# Patient Record
Sex: Male | Born: 1975
Health system: Southern US, Community
[De-identification: ages and names within clinical notes are randomized; demographics above are authoritative.]

## PROBLEM LIST (undated history)

## (undated) DIAGNOSIS — K219 Gastro-esophageal reflux disease without esophagitis: Secondary | ICD-10-CM

## (undated) DIAGNOSIS — F988 Other specified behavioral and emotional disorders with onset usually occurring in childhood and adolescence: Secondary | ICD-10-CM

## (undated) DIAGNOSIS — J302 Other seasonal allergic rhinitis: Secondary | ICD-10-CM

## (undated) HISTORY — PX: RHINOPLASTY: SUR1284

## (undated) HISTORY — DX: Gastro-esophageal reflux disease without esophagitis: K21.9

---

## 2013-11-28 ENCOUNTER — Encounter (HOSPITAL_COMMUNITY): Payer: Self-pay | Admitting: Emergency Medicine

## 2013-11-28 ENCOUNTER — Emergency Department (HOSPITAL_COMMUNITY)
Admission: EM | Admit: 2013-11-28 | Discharge: 2013-11-28 | Disposition: A | Discharge status: Home / Self Care | Payer: No Typology Code available for payment source | Attending: Emergency Medicine | Admitting: Emergency Medicine

## 2013-11-28 ENCOUNTER — Emergency Department (HOSPITAL_COMMUNITY): Payer: No Typology Code available for payment source

## 2013-11-28 DIAGNOSIS — J189 Pneumonia, unspecified organism: Secondary | ICD-10-CM

## 2013-11-28 DIAGNOSIS — J159 Unspecified bacterial pneumonia: Secondary | ICD-10-CM | POA: Insufficient documentation

## 2013-11-28 DIAGNOSIS — K219 Gastro-esophageal reflux disease without esophagitis: Secondary | ICD-10-CM | POA: Insufficient documentation

## 2013-11-28 DIAGNOSIS — K5900 Constipation, unspecified: Secondary | ICD-10-CM | POA: Insufficient documentation

## 2013-11-28 DIAGNOSIS — Z79899 Other long term (current) drug therapy: Secondary | ICD-10-CM | POA: Insufficient documentation

## 2013-11-28 DIAGNOSIS — H6690 Otitis media, unspecified, unspecified ear: Secondary | ICD-10-CM | POA: Insufficient documentation

## 2013-11-28 DIAGNOSIS — Z8659 Personal history of other mental and behavioral disorders: Secondary | ICD-10-CM | POA: Insufficient documentation

## 2013-11-28 DIAGNOSIS — R0789 Other chest pain: Secondary | ICD-10-CM | POA: Insufficient documentation

## 2013-11-28 DIAGNOSIS — I10 Essential (primary) hypertension: Secondary | ICD-10-CM | POA: Insufficient documentation

## 2013-11-28 DIAGNOSIS — R109 Unspecified abdominal pain: Secondary | ICD-10-CM | POA: Insufficient documentation

## 2013-11-28 DIAGNOSIS — H6691 Otitis media, unspecified, right ear: Secondary | ICD-10-CM

## 2013-11-28 HISTORY — DX: Other specified behavioral and emotional disorders with onset usually occurring in childhood and adolescence: F98.8

## 2013-11-28 HISTORY — DX: Other seasonal allergic rhinitis: J30.2

## 2013-11-28 LAB — CBC
HEMATOCRIT: 43 % (ref 39.0–52.0)
HEMOGLOBIN: 14.2 g/dL (ref 13.0–17.0)
MCH: 23.4 pg — ABNORMAL LOW (ref 26.0–34.0)
MCHC: 33 g/dL (ref 30.0–36.0)
MCV: 70.8 fL — AB (ref 78.0–100.0)
Platelets: 209 10*3/uL (ref 150–400)
RBC: 6.07 MIL/uL — ABNORMAL HIGH (ref 4.22–5.81)
RDW: 16 % — ABNORMAL HIGH (ref 11.5–15.5)
WBC: 8 10*3/uL (ref 4.0–10.5)

## 2013-11-28 LAB — BASIC METABOLIC PANEL
BUN: 9 mg/dL (ref 6–23)
CO2: 29 mEq/L (ref 19–32)
CREATININE: 0.97 mg/dL (ref 0.50–1.35)
Calcium: 9.2 mg/dL (ref 8.4–10.5)
Chloride: 103 mEq/L (ref 96–112)
GFR calc Af Amer: >90 mL/min (ref >90)
GFR calc non Af Amer: >90 mL/min (ref >90)
GLUCOSE: 92 mg/dL (ref 70–99)
POTASSIUM: 4.6 meq/L (ref 3.7–5.3)
Sodium: 143 mEq/L (ref 137–147)

## 2013-11-28 LAB — TROPONIN I
Troponin I: <0.3 ng/mL (ref <0.30)
Troponin I: <0.3 ng/mL (ref <0.30)

## 2013-11-28 MED ORDER — AZITHROMYCIN 250 MG PO TABS
500.0000 mg | ORAL_TABLET | Freq: Once | ORAL | Status: AC
  Administered 2013-11-28: 500 mg via ORAL
  Filled 2013-11-28: qty 2

## 2013-11-28 MED ORDER — RANITIDINE HCL 150 MG PO TABS: 150.0000 mg | ORAL_TABLET | Freq: Two times a day (BID) | ORAL | Status: DC

## 2013-11-28 MED ORDER — AZITHROMYCIN 250 MG PO TABS: 250.0000 mg | ORAL_TABLET | Freq: Every day | ORAL | Status: DC

## 2013-11-28 MED ORDER — PREDNISONE 20 MG PO TABS: ORAL_TABLET | ORAL | Status: DC

## 2013-11-28 MED ORDER — ANTIPYRINE-BENZOCAINE 5.4-1.4 % OT SOLN
2.0000 [drp] | Freq: Once | OTIC | Status: AC
  Administered 2013-11-28: 2 [drp] via OTIC
  Filled 2013-11-28: qty 10

## 2013-11-28 MED ORDER — ALBUTEROL SULFATE HFA 108 (90 BASE) MCG/ACT IN AERS
2.0000 | INHALATION_SPRAY | Freq: Once | RESPIRATORY_TRACT | Status: AC
  Administered 2013-11-28: 2 via RESPIRATORY_TRACT
  Filled 2013-11-28: qty 6.7

## 2013-11-28 NOTE — ED Notes (Signed)
PT ambulated with baseline gait; VSS; A&Ox3; no signs of distress; respirations even and unlabored; skin warm and dry; no questions upon discharge.  

## 2013-11-28 NOTE — Discharge Instructions (Signed)
Please call your doctor for a followup appointment within 24-48 hours. When you talk to your doctor please let them know that you were seen in the emergency department and have them acquire all of your records so that they can discuss the findings with you and formulate a treatment plan to fully care for your new and ongoing problems. Please rest and stay hydrated Please take antibiotics as prescribed for pneumonia and ear infection  Please avoid any physical or strenuous activity Please continue to monitor symptoms closely and if symptoms are to worsen or change (fever greater than 101, chills, sweating, nausea, vomiting, diarrhea, chest pain, shortness of breath, difficulty breathing, coughing up bright red blood, dizziness, weakness, inability to keep food or fluid down) please report back to the ED immediately  Otitis Media, Adult Otitis media is redness, soreness, and puffiness (swelling) in the space just behind your eardrum (middle ear). It may be caused by allergies or infection. It often happens along with a cold. HOME CARE  Take your medicine as told. Finish it even if you start to feel better.  Only take over-the-counter or prescription medicines for pain, discomfort, or fever as told by your doctor.  Follow up with your doctor as told. GET HELP IF:  You have otitis media only in one ear or bleeding from your nose or both.  You notice a lump on your neck.  You are not getting better in 3 5 days.  You feel worse instead of better. GET HELP RIGHT AWAY IF:   You have pain that is not helped with medicine.  You have puffiness, redness, or pain around your ear.  You get a stiff neck.  You cannot move part of your face (paralysis).  You notice that the bone behind your ear hurts when you touch it. MAKE SURE YOU:   Understand these instructions.  Will watch your condition.  Will get help right away if you are not doing well or get worse. Document Released: 03/12/2008  Document Revised: 05/27/2013 Document Reviewed: 04/21/2013 [facility] Patient Information 2014 [place], [place]. Pneumonia, Adult Pneumonia is an infection of the lungs.  CAUSES Pneumonia may be caused by bacteria or a virus. Usually, these infections are caused by breathing infectious particles into the lungs (respiratory tract). SYMPTOMS   Cough.  Fever.  Chest pain.  Increased rate of breathing.  Wheezing.  Mucus production. DIAGNOSIS  If you have the common symptoms of pneumonia, your caregiver will typically confirm the diagnosis with a chest X-ray. The X-ray will show an abnormality in the lung (pulmonary infiltrate) if you have pneumonia. Other tests of your blood, urine, or sputum may be done to find the specific cause of your pneumonia. Your caregiver may also do tests (blood gases or pulse oximetry) to see how well your lungs are working. TREATMENT  Some forms of pneumonia may be spread to other people when you cough or sneeze. You may be asked to wear a mask before and during your exam. Pneumonia that is caused by bacteria is treated with antibiotic medicine. Pneumonia that is caused by the influenza virus may be treated with an antiviral medicine. Most other viral infections must run their course. These infections will not respond to antibiotics.  PREVENTION A pneumococcal shot (vaccine) is available to prevent a common bacterial cause of pneumonia. This is usually suggested for:  People over 63 years old.  Patients on chemotherapy.  People with chronic lung problems, such as bronchitis or emphysema.  People with immune system  problems. If you are over 65 or have a high risk condition, you may receive the pneumococcal vaccine if you have not received it before. In some countries, a routine influenza vaccine is also recommended. This vaccine can help prevent some cases of pneumonia.You may be offered the influenza vaccine as part of your care. If you smoke, it is time to  quit. You may receive instructions on how to stop smoking. Your caregiver can provide medicines and counseling to help you quit. HOME CARE INSTRUCTIONS   Cough suppressants may be used if you are losing too much rest. However, coughing protects you by clearing your lungs. You should avoid using cough suppressants if you can.  Your caregiver may have prescribed medicine if he or she thinks your pneumonia is caused by a bacteria or influenza. Finish your medicine even if you start to feel better.  Your caregiver may also prescribe an expectorant. This loosens the mucus to be coughed up.  Only take over-the-counter or prescription medicines for pain, discomfort, or fever as directed by your caregiver.  Do not smoke. Smoking is a common cause of bronchitis and can contribute to pneumonia. If you are a smoker and continue to smoke, your cough may last several weeks after your pneumonia has cleared.  A cold steam vaporizer or humidifier in your room or home may help loosen mucus.  Coughing is often worse at night. Sleeping in a semi-upright position in a recliner or using a couple pillows under your head will help with this.  Get rest as you feel it is needed. Your body will usually let you know when you need to rest. SEEK IMMEDIATE MEDICAL CARE IF:   Your illness becomes worse. This is especially true if you are elderly or weakened from any other disease.  You cannot control your cough with suppressants and are losing sleep.  You begin coughing up blood.  You develop pain which is getting worse or is uncontrolled with medicines.  You have a fever.  Any of the symptoms which initially brought you in for treatment are getting worse rather than better.  You develop shortness of breath or chest pain. MAKE SURE YOU:   Understand these instructions.  Will watch your condition.  Will get help right away if you are not doing well or get worse. Document Released: 09/24/2005 Document  Revised: 12/17/2011 Document Reviewed: 12/14/2010 [facility] Patient Information 2014 [place], [place].   Emergency Department Resource Guide 1) Find a Doctor and Pay Out of Pocket Although you won't have to find out who is covered by your insurance plan, it is a good idea to ask around and get recommendations. You will then need to call the office and see if the doctor you have chosen will accept you as a new patient and what types of options they offer for patients who are self-pay. Some doctors offer discounts or will set up payment plans for their patients who do not have insurance, but you will need to ask so you aren't surprised when you get to your appointment.  2) Contact Your Local Health Department Not all health departments have doctors that can see patients for sick visits, but many do, so it is worth a call to see if yours does. If you don't know where your local health department is, you can check in your phone book. The CDC also has a tool to help you locate your state's health department, and many state websites also have listings of all of their local  health departments.  3) Find a Walk-in Clinic If your illness is not likely to be very severe or complicated, you may want to try a walk in clinic. These are popping up all over the country in pharmacies, drugstores, and shopping centers. They're usually staffed by nurse practitioners or physician assistants that have been trained to treat common illnesses and complaints. They're usually fairly quick and inexpensive. However, if you have serious medical issues or chronic medical problems, these are probably not your best option.  No Primary Care Doctor: - Call Health Connect at  [phone] - they can help you locate a primary care doctor that  accepts your insurance, provides certain services, etc. - Physician Referral Service- [phone]  Chronic Pain Problems: Organization         Address  Phone   Notes  [facility]  [phone] Patients need to be referred by their primary care doctor.   Medication Assistance: Organization         Address  Phone   Notes  [facility] Medication [facility] [address] [place] [place], [place] [address] [phone] --Must be a resident of [facility] -- Must have NO insurance coverage whatsoever (no Medicaid/ Medicare, etc.) -- The pt. MUST have a primary care doctor that directs their care regularly and follows them in the community   [facility]ist  [phone]   [employer]  [phone]    Agencies that provide inexpensive medical care: Organization         Address  Phone   Notes  [facility]  [phone]   [facility]    [phone]   [facility] [address] [place], [place] [address] [phone]   [facility] [address] [place] [place] [phone]   Planned Parenthood    [phone]   [facility]    [phone]   [facility]  [address], [place] Phone:  [phone], Fax:  [phone] Hours of Operation:  9 am - 6 pm, M-F.  Also accepts Medicaid/Medicare and self-pay.  [facility]  [address], [place] Phone: [phone], Fax: [phone]. Hours of Operation:  8:30 am - 5:30 pm, M-F.  Also accepts Medicaid and self-pay.  [facility] [place] [address] [place] Phone: [phone]   [facility] [address] Natasha Bence [place], [place] [phone], Ext. [phone] Mondays & Thursdays: 7-9 AM.  First 15 patients are seen on a first come, first serve basis.    Medicaid-accepting [facility] Providers:  Organization         Address  Phone   Notes  [facility] [address], [place] [phone] Also accepts self-pay patients.  [facility] [address] Laurell Josephs [place],  [place]  [phone]   [facility] [address], [place] [phone]   [facility] [address] [place] [phone]   Renaye Rakers [address], [place]   [phone] Only accepts [place] patients after they have their name applied to their card.   Self-Pay (no insurance) in [facility]:  Organization         Address  Phone   Notes  Sickle Cell Patients, [employer]  Internal Medicine [address] [place], [place] [phone]   [facility] [address] [place], [place] [phone]   [facility] [place]  [address], [place] [phone]   [facility]/Dr. Osei-Bonsu  [address] [place] or [address], [place] [phone] Phone number for both [place] and [place] locations is the same.  [facility] [address] [place] [phone]   [facility] [address] [place] or [address] [phone]   [facility] [address] [place] [phone], phone; [phone], fax Sees patients 1st and 3rd Saturday of every month.  Must not qualify for public or private insurance (i.e. Medicaid, Medicare, [place] Health Choice, Veterans' Benefits)  Household income should be no more than 200% of the poverty level The clinic cannot treat you if you are pregnant or think you are pregnant  Sexually transmitted diseases are not treated at the clinic.    Dental Care: Organization         Address  Phone  Notes  [facility] [address] [place], [place] [phone] Accepts children up to age 73 who are enrolled in [place] or [place] Health Choice; pregnant women with a Medicaid card; and children who have applied for Medicaid or [place] [facility]  Choice, but were declined, whose parents can pay a reduced fee at time of service.  [facility] Department of [facility] [place]  [address], [place] [phone] Accepts children up to age 14 who are enrolled in [place] or  Health Choice; pregnant women with a Medicaid card; and children who have applied for Medicaid or  Health Choice, but were declined, whose parents can pay a reduced fee at time of service.  [facility]  [address] [place], [place] [phone] Patients are seen by appointment only. Walk-ins are not accepted. [facility] will see patients 81 years of age and older. Monday - Tuesday (8am-5pm) Most Wednesdays (8:30-5pm) $30 per visit, cash only  [facility] Adult Dental Access [facility]  [address], [facility] [phone] Patients are seen by appointment only. Walk-ins are not accepted. [facility] will see patients 48 years of age and older. One Wednesday Evening (Monthly: Volunteer Based).  $30 per visit, cash only  [employer]  [phone] for adults; Children under age 39, call Graduate Pediatric Dentistry at [phone]. Children aged 37-14, please call [phone] to request a pediatric application.  Dental services are provided in all areas of dental care including fillings, crowns and bridges, complete and partial dentures, implants, gum treatment, root canals, and extractions. Preventive care is also provided. Treatment is provided to both adults and children. Patients are selected via a lottery and there is often a waiting list.   [facility] [address], [place]  [phone] [email or web address]   [facility] [address] [place], [place] [phone], Ext. [phone] Second and Fourth Thursday of each month, opens at 6:30 AM; Clinic ends at 9 AM.  Patients are seen on a first-come first-served basis, and a limited number are seen during each  clinic.   [facility]  [address] Ether Griffins [place], [place] [phone]   Eligibility Requirements You must have  lived in [place], [place], or [place] counties for at least the last three months.   You cannot be eligible for state or federal sponsored [place]healthcare insurance, including CIGNAVeterans Administration, [place]icaid, or [employer]icare.   You generally cannot be eligible for healthcare insurance through your employer.    How to apply: Eligibility screenings are held every Tuesday and Wednesday afternoon from 1:00 pm until 4:00 pm. You do not need an appointment for the interview!  [facility] [address], [place], [place] [phone]   [facility]  [phone]   [facility]  [phone]   [facility]  [phone]    Behavioral Health Resources in the Community: Intensive Outpatient Programs Organization         Address  Phone  Notes  [facility] [address], [place], [place] [phone]   [facility] [address], [place], [place] [phone]   [facility] [address], [place], [place]  [phone]   [facility] [address],  [place], [place] [phone] or [phone]   Substance Abuse Resources Organization         Address  Phone  Notes  [facility]  [phone]   [facility]  [phone]   [facility]  [phone]   [facility]  [phone]   Residential & Outpatient Substance Abuse Program  [phone]   Psychological Services Organization         Address  Phone  Notes  [facility]  [phone]- [phone]   [facility]  [phone]- [phone]   [facility] [address], [place] [phone] or [phone]    Mobile Crisis Teams Organization         Address  Phone  Notes  [facility], Mobile  Crisis Care Unit  [phone]   [facility]  [address] [place], [place] [phone]   Doristine LocksSharon DeEsch [address] [place] [phone]    Self-Help/Support Groups Organization         Address  Phone             Notes  [facility] [place] - variety of support groups  336- [phone] Call for more information  Narcotics Anonymous (NA), [facility] [address], [place]  2 meetings at this location   Statisticianesidential Treatment Programs Organization         Address  Phone  Notes  [facility] [address],    [place]  [phone]   [facility]  [address], [place] [phone], [place], [place] [phone]   [facility] [address], [place] [phone] Admissions: 8am-3pm M-F  [facility] [address]    [place], [place] [phone]   [facility] [address], [place], [place] [phone]   [facility] [address]  [place], [place] [phone]   [facility] - Intensive Outpatient [address], [place], [place] [phone]   [facility] ([facility] [address]  [place], [place] [phone] or [phone]   [facility] ([facility]) [address] [place], [place] [phone] Accepts Medicaid  [facility] [address] [place] [phone] Substance Abuse/Addiction Treatment   [facility] Resources Organization         Address  Phone  Notes  [facility]  [phone]   Angie Fava, PhD [address] Ervin Knack [place], [place]   [phone] or [phone]   [facility]   [address] [place], [place] [phone]   [facility] [address], [place], [place] [phone] Insurance/Medicaid/sponsorship through [facility] and Families [address]                                    [place], [place] [phone] Therapy/tele-psych/case  [facility] [address], [place] [phone]    Dr. Lolly Mustache  [phone]   [facility]  [employer] [facility] Dept. 1) [address] [place] 2) [address] [place] 3)  [address], [place] [phone]  [phone]   [facility] Child Abuse Hotline [phone] or [phone] (After Hours)

## 2013-11-28 NOTE — ED Notes (Signed)
Back from xray

## 2013-11-28 NOTE — ED Notes (Signed)
Pt. Stated, It might be my allergies, but I've had ear pain and gas in my stomach for 2 weeks.

## 2013-11-28 NOTE — ED Provider Notes (Signed)
CSN: [id number]     Arrival date & time 11/28/13  1318 History   First MD Initiated Contact with Patient 11/28/13 1436     Chief Complaint  Patient presents with  . Otalgia     (Consider location/radiation/quality/duration/timing/severity/associated sxs/prior Treatment) The history is provided by the patient. No language interpreter was used.  Michael Daugherty is a 38 y/o M with PMHx of HTN, ADHD, seasonal allergies presenting to the ED with cough, nasal congestion, rhinorrhea for the past week and a half and right ear pain that started 2 weeks ago. Patient reported that he has been having right ear pain for the past 2 weeks described as a constant throbbing, aching sensation that has gotten worse. Reported that he does use q-tips. Reported that he has been having a wet cough with white phlegm and white rhinorrhea. Stated that he has been having constipation for the past couple of days with gas build up - reported that he has been using colace that his mother got for him that enabled him to use the bathroom - reported that his last BM was this morning, denied any issues. Reported that he has been having chest pain localized to the center of his chest described as a burning tightness sensation that has been occuring everyday for the past moth - reported that he has never experienced anything like this before. Reported that there is no trend to the chest pain, reported that it can occur when resting and when active - not associated with shortness of breath or difficulty breathing. Stated that he does have GERD and normally takes ranitidine, but stated that the pain does not occur after meals or when laying down - reported the chest pain is random. Patient reported that he recently moved from [place] and does not have a PCP. Denied shortness of breath, difficulty breathing, numbness, tingling, nausea, vomiting, diarrhea, jaw pain, fever, chills, neck pain, neck stiffness, inability to swallowing. PCP  none   Past Medical History  Diagnosis Date  . Attention deficit disorder (ADD)   . Seasonal allergies    Past Surgical History  Procedure Laterality Date  . Rhinoplasty     No family history on file. History  Substance Use Topics  . Smoking status: Not on file  . Smokeless tobacco: Not on file  . Alcohol Use: No    Review of Systems  Constitutional: Negative for fever and chills.  HENT: Positive for congestion, ear pain (right ear ) and sore throat. Negative for trouble swallowing.   Respiratory: Positive for cough. Negative for shortness of breath.   Cardiovascular: Positive for chest pain.  Gastrointestinal: Positive for abdominal pain and constipation. Negative for nausea, vomiting and diarrhea.  Musculoskeletal: Negative for neck pain and neck stiffness.  Neurological: Negative for dizziness and weakness.  All other systems reviewed and are negative.      Allergies  Other; Oysters; Soybean-containing drug products; and Strawberry  Home Medications   Current Outpatient Rx  Name  Route  Sig  Dispense  Refill  . fluticasone (FLONASE) 50 MCG/ACT nasal spray   Each Nare   Place 2 sprays into both nostrils daily as needed for allergies or rhinitis.         [name] omeprazole (PRILOSEC) 20 MG capsule   Oral   Take 20 mg by mouth daily.         [name] OVER THE COUNTER MEDICATION   Oral   Take 2 tablets by mouth daily as needed. "Mucus Relief"  BP 143/102  Pulse 77  Temp(Src) 98 F (36.7 C) (Oral)  Resp 18  SpO2 100% Physical Exam  Nursing note and vitals reviewed. Constitutional: He is oriented to person, place, and time. He appears well-developed and well-nourished. No distress.  HENT:  Head: Normocephalic and atraumatic.  Right Ear: Hearing, external ear and ear canal normal. No drainage, swelling or tenderness. Tympanic membrane is injected. Tympanic membrane is not scarred, not perforated, not erythematous, not retracted and not bulging.  Left Ear:  Hearing, tympanic membrane, external ear and ear canal normal. No drainage, swelling or tenderness. Tympanic membrane is not injected, not scarred, not perforated, not erythematous, not retracted and not bulging.  Mouth/Throat: Oropharynx is clear and moist. No oropharyngeal exudate.  Negative facial swelling Negative swelling, erythema, inflammation, lesions, sores, exudate, petechiae noted to the posterior oropharynx and tonsils. Uvula midline, symmetrical elevation. Negative uvula swelling.   Right Ear: Negative swelling, erythema, inflammation, lesions, sores, drainage noted to the external ear. Negative pain upon palpation to the mastoid. Negative preauricular and post-auricular lymph node swelling. Negative swelling, erythema, lesions noted to the ear canal of the right ear. TM noted to have erythema with white opaque fluid collection - negative bulging noted.   Eyes: Conjunctivae and EOM are normal. Pupils are equal, round, and reactive to light. Right eye exhibits no discharge. Left eye exhibits no discharge.  Neck: Normal range of motion. Neck supple. No tracheal deviation present.  Negative neck stiffness Negative nuchal rigidity Negative cervical lymphadenopathy Negative meningeal signs   Cardiovascular: Normal rate, regular rhythm and normal heart sounds.  Exam reveals no friction rub.   No murmur heard. Pulses:      Radial pulses are 2+ on the right side, and 2+ on the left side.  Cap refill < 3 seconds   Pulmonary/Chest: Effort normal and breath sounds normal. No respiratory distress. He has no wheezes. He has no rales.  Abdominal: Soft. Bowel sounds are normal. There is no tenderness. There is no guarding.  Musculoskeletal: Normal range of motion.  Full ROM to upper and lower extremities without difficulty noted, negative ataxia noted.  Lymphadenopathy:    He has no cervical adenopathy.  Neurological: He is alert and oriented to person, place, and time. No cranial nerve deficit.  He exhibits normal muscle tone. Coordination normal.  Cranial nerves III-XII grossly intact Strength 5+/5+ to upper and lower extremities bilaterally with resistance applied, equal distribution noted  Skin: Skin is warm and dry. No rash noted. He is not diaphoretic. No erythema.  Psychiatric: He has a normal mood and affect. His behavior is normal. Thought content normal.    ED Course  Procedures (including critical care time)  Results for orders placed during the hospital encounter of 11/28/13  CBC      Result Value Ref Range   WBC 8.0  4.0 - 10.5 K/uL   RBC 6.07 (*) 4.22 - 5.81 MIL/uL   Hemoglobin 14.2  13.0 - 17.0 g/dL   HCT 40.943.0  81.139.0 - 91.452.0 %   MCV 70.8 (*) 78.0 - 100.0 fL   MCH 23.4 (*) 26.0 - 34.0 pg   MCHC 33.0  30.0 - 36.0 g/dL   RDW 78.216.0 (*) 95.611.5 - 21.315.5 %   Platelets 209  150 - 400 K/uL  BASIC METABOLIC PANEL      Result Value Ref Range   Sodium 143  137 - 147 mEq/L   Potassium 4.6  3.7 - 5.3 mEq/L   Chloride 103  96 -  112 mEq/L   CO2 29  19 - 32 mEq/L   Glucose, Bld 92  70 - 99 mg/dL   BUN 9  6 - 23 mg/dL   Creatinine, Ser 1.61  0.50 - 1.35 mg/dL   Calcium 9.2  8.4 - 09.6 mg/dL   GFR calc non Af Amer >90  >90 mL/min   GFR calc Af Amer >90  >90 mL/min  TROPONIN I      Result Value Ref Range   Troponin I <0.30  <0.30 ng/mL    Labs Review Labs Reviewed  CBC - Abnormal; Notable for the following:    RBC 6.07 (*)    MCV 70.8 (*)    MCH 23.4 (*)    RDW 16.0 (*)    All other components within normal limits  BASIC METABOLIC PANEL  TROPONIN I  TROPONIN I   Imaging Review No results found.  EKG Interpretation   None      Date: 11/28/2013  Rate: 60  Rhythm: normal sinus rhythm  QRS Axis: normal  Intervals: normal  ST/T Wave abnormalities: normal  Conduction Disutrbances:none  Narrative Interpretation:   Old EKG Reviewed: none available EKG analyzed and reviewed by this provider and attending physician.     MDM   Final diagnoses:  None    Filed Vitals:   11/28/13 1359 11/28/13 1646  BP: 138/90 143/102  Pulse: 59 77  Temp: 97.7 F (36.5 C) 98 F (36.7 C)  TempSrc: Oral Oral  Resp: 17 18  SpO2: 98% 100%   Patient presenting to the ED with nasal congestion, productive cough that has been ongoing for the past week and a half. Patient reported that he has been experiencing right ear pain for the past 2 weeks - reported that the pain is an aching, throbbing sensation that is constant. Stated that he has been mildly constipated for the past couple of days, but has been using colace - reported that he had a bowel movement that was normal this morning - reported that he has been dealing with constipation and gas issues most of his life. Reported that he has been experiencing chest pain for the past month localized to the center of the chest described as a tightness sensation - patient reported that there is no trend to the chest pain and that the pain is sporadic. Patient reported that he does take medications for GERD, but this pain is different.  Alert and oriented.  GCS 15. Heart rate and rhythm normal. Lungs clear to auscultation. Radial pulses 2+ bilaterally. Cap refill < 3 seconds. Negative pain upon palpation to the maxillary and frontal sinuses. Right ear noted erythema to the TM with white opaque fluid noted to behind the TM - negative swelling or drainage noted to the right ear. Negative mastoid tenderness. Negative lymphadenopathy to the pre- and post auricular regions. Negative neck stiffness, negative cervical LAD, negative meningeal signs. Unremarkable oral exam. BS normoactive in all quadrants, negative pain upon palpation to the abdomen - negative acute abdomen, negative peritoneal signs - nonsurgical abdomen noted.  EKG noted normal sinus rhythm with a heart rate of 60 bpm. Troponin negative elevation. CBC negative elevation of WBC - negative left shift or leukocytosis noted. BMP negative findings. Chest xray noted right  lower lobes pneumonia.  Doubt strep pharyngitis. Doubt peritonsillar abscess. Patient presenting to the ED with right lower lobe pneumonia. Negative signs of sepsis noted - negative elevated WBC, afebrile, negative hypotensive, negative drop in pulse ox. Second troponin  ordered. Abdominal plain film ordered and pending based on constipation. Pain medications for ear and one round of antibiotics given in ED setting. Discharge paper work complete. Patient to be ambulated to see if patient is hypoxic. Second troponin, hypoxia and vitals to be checked. Discussed case with Roxy Horseman, PA-C. Transfer of care to Roxy Horseman, PA-C at change in shift.   Raymon Mutton, PA-C 11/29/13 1328

## 2013-11-29 NOTE — ED Provider Notes (Signed)
Medical screening examination/treatment/procedure(s) were performed by non-physician practitioner and as supervising physician I was immediately available for consultation/collaboration.  EKG Interpretation   None        Shon Batonourtney F Alania Overholt, MD 11/29/13 Ernestina [id number]

## 2013-12-07 ENCOUNTER — Emergency Department (HOSPITAL_COMMUNITY)
Admission: EM | Admit: 2013-12-07 | Discharge: 2013-12-07 | Disposition: A | Discharge status: Home / Self Care | Payer: No Typology Code available for payment source | Attending: Emergency Medicine | Admitting: Emergency Medicine

## 2013-12-07 ENCOUNTER — Telehealth (HOSPITAL_COMMUNITY): Payer: Self-pay | Admitting: *Deleted

## 2013-12-07 ENCOUNTER — Emergency Department (HOSPITAL_COMMUNITY): Payer: No Typology Code available for payment source

## 2013-12-07 ENCOUNTER — Encounter (HOSPITAL_COMMUNITY): Payer: Self-pay | Admitting: Emergency Medicine

## 2013-12-07 DIAGNOSIS — J309 Allergic rhinitis, unspecified: Secondary | ICD-10-CM | POA: Insufficient documentation

## 2013-12-07 DIAGNOSIS — F988 Other specified behavioral and emotional disorders with onset usually occurring in childhood and adolescence: Secondary | ICD-10-CM | POA: Insufficient documentation

## 2013-12-07 DIAGNOSIS — R079 Chest pain, unspecified: Secondary | ICD-10-CM

## 2013-12-07 DIAGNOSIS — Z87891 Personal history of nicotine dependence: Secondary | ICD-10-CM | POA: Insufficient documentation

## 2013-12-07 DIAGNOSIS — R12 Heartburn: Secondary | ICD-10-CM | POA: Insufficient documentation

## 2013-12-07 DIAGNOSIS — H9209 Otalgia, unspecified ear: Secondary | ICD-10-CM

## 2013-12-07 LAB — I-STAT TROPONIN, ED: TROPONIN I, POC: 0.01 ng/mL (ref 0.00–0.08)

## 2013-12-07 LAB — BASIC METABOLIC PANEL
BUN: 11 mg/dL (ref 6–23)
CHLORIDE: 101 meq/L (ref 96–112)
CO2: 30 meq/L (ref 19–32)
CREATININE: 1.01 mg/dL (ref 0.50–1.35)
Calcium: 9.4 mg/dL (ref 8.4–10.5)
GFR calc Af Amer: >90 mL/min (ref >90)
GFR calc non Af Amer: >90 mL/min (ref >90)
GLUCOSE: 85 mg/dL (ref 70–99)
Potassium: 4.5 mEq/L (ref 3.7–5.3)
Sodium: 141 mEq/L (ref 137–147)

## 2013-12-07 LAB — CBC
HEMATOCRIT: 43 % (ref 39.0–52.0)
Hemoglobin: 14.1 g/dL (ref 13.0–17.0)
MCH: 23.2 pg — AB (ref 26.0–34.0)
MCHC: 32.8 g/dL (ref 30.0–36.0)
MCV: 70.8 fL — AB (ref 78.0–100.0)
Platelets: 154 10*3/uL (ref 150–400)
RBC: 6.07 MIL/uL — AB (ref 4.22–5.81)
RDW: 16.5 % — ABNORMAL HIGH (ref 11.5–15.5)
WBC: 7.9 10*3/uL (ref 4.0–10.5)

## 2013-12-07 MED ORDER — ACETAMINOPHEN 325 MG PO TABS
650.0000 mg | ORAL_TABLET | Freq: Once | ORAL | Status: AC
  Administered 2013-12-07: 650 mg via ORAL
  Filled 2013-12-07: qty 2

## 2013-12-07 NOTE — ED Provider Notes (Signed)
I saw and evaluated the patient, reviewed the resident's note and I agree with the findings and plan.   .Face to face Exam:  General:  Awake HEENT:  Atraumatic Resp:  Normal effort Abd:  Nondistended Neuro:No focal weakness    Nelia Shiobert L Amonie Wisser, MD 12/07/13 1237

## 2013-12-07 NOTE — ED Notes (Signed)
Pt called because he was told by MD that he was going to write a rx for pain meds and drops for ear infection.  No rx seen in chart,  Pt instructed to follow up with wellness center tomorrow (already has appointment) or that he can return to ED if condition worsens.

## 2013-12-07 NOTE — Discharge Instructions (Signed)
Chest Pain (Nonspecific) °It is often hard to give a specific diagnosis for the cause of chest pain. There is always a chance that your pain could be related to something serious, such as a heart attack or a blood clot in the lungs. You need to follow up with your caregiver for further evaluation. °CAUSES  °· Heartburn. °· Pneumonia or bronchitis. °· Anxiety or stress. °· Inflammation around your heart (pericarditis) or lung (pleuritis or pleurisy). °· A blood clot in the lung. °· A collapsed lung (pneumothorax). It can develop suddenly on its own (spontaneous pneumothorax) or from injury (trauma) to the chest. °· Shingles infection (herpes zoster virus). °The chest wall is composed of bones, muscles, and cartilage. Any of these can be the source of the pain. °· The bones can be bruised by injury. °· The muscles or cartilage can be strained by coughing or overwork. °· The cartilage can be affected by inflammation and become sore (costochondritis). °DIAGNOSIS  °Lab tests or other studies, such as X-rays, electrocardiography, stress testing, or cardiac imaging, may be needed to find the cause of your pain.  °TREATMENT  °· Treatment depends on what may be causing your chest pain. Treatment may include: °· Acid blockers for heartburn. °· Anti-inflammatory medicine. °· Pain medicine for inflammatory conditions. °· Antibiotics if an infection is present. °· You may be advised to change lifestyle habits. This includes stopping smoking and avoiding alcohol, caffeine, and chocolate. °· You may be advised to keep your head raised (elevated) when sleeping. This reduces the chance of acid going backward from your stomach into your esophagus. °· Most of the time, nonspecific chest pain will improve within 2 to 3 days with rest and mild pain medicine. °HOME CARE INSTRUCTIONS  °· If antibiotics were prescribed, take your antibiotics as directed. Finish them even if you start to feel better. °· For the next few days, avoid physical  activities that bring on chest pain. Continue physical activities as directed. °· Do not smoke. °· Avoid drinking alcohol. °· Only take over-the-counter or prescription medicine for pain, discomfort, or fever as directed by your caregiver. °· Follow your caregiver's suggestions for further testing if your chest pain does not go away. °· Keep any follow-up appointments you made. If you do not go to an appointment, you could develop lasting (chronic) problems with pain. If there is any problem keeping an appointment, you must call to reschedule. °SEEK MEDICAL CARE IF:  °· You think you are having problems from the medicine you are taking. Read your medicine instructions carefully. °· Your chest pain does not go away, even after treatment. °· You develop a rash with blisters on your chest. °SEEK IMMEDIATE MEDICAL CARE IF:  °· You have increased chest pain or pain that spreads to your arm, neck, jaw, back, or abdomen. °· You develop shortness of breath, an increasing cough, or you are coughing up blood. °· You have severe back or abdominal pain, feel nauseous, or vomit. °· You develop severe weakness, fainting, or chills. °· You have a fever. °THIS IS AN EMERGENCY. Do not wait to see if the pain will go away. Get medical help at once. Call your local emergency services (911 in [place] Do not drive yourself to the hospital. °MAKE SURE YOU:  °· Understand these instructions. °· Will watch your condition. °· Will get help right away if you are not doing well or get worse. °Document Released: 07/04/2005 Document Revised: 12/17/2011 Document Reviewed: 04/29/2008 °ExitCare® Patient Information ©2014 [employer]   [employer]. ° °

## 2013-12-07 NOTE — ED Notes (Signed)
Pt ambulated to bathroom with no difficulty 

## 2013-12-07 NOTE — ED Provider Notes (Signed)
CSN: [id number]     Arrival date & time 12/07/13  1044 History   First MD Initiated Contact with Patient 12/07/13 1101     Chief Complaint  Patient presents with  . Chest Pain  . Back Pain    bilateral sides and upper back     Patient is a 38 y.o. male presenting with chest pain and back pain. The history is provided by the patient.  Chest Pain Pain location:  Substernal area Pain quality: burning   Pain radiates to:  Does not radiate Pain radiates to the back: no   Pain severity:  Mild Onset quality:  Gradual Duration: years. Timing:  Intermittent Progression:  Resolved Chronicity:  Recurrent Context: eating   Context: not breathing and no drug use   Relieved by:  Antacids Worsened by:  Nothing tried Ineffective treatments:  None tried Associated symptoms: heartburn   Associated symptoms: no abdominal pain, no altered mental status, no anorexia, no back pain, no claudication, no cough, no diaphoresis, no dizziness, no fever, no headache, no nausea, no near-syncope, no numbness, no orthopnea, no palpitations, no PND, no shortness of breath, no syncope and not vomiting   Risk factors: male sex   Risk factors: no aortic disease, no birth control, no coronary artery disease, no diabetes mellitus, no Ehlers-Danlos syndrome, not obese, not pregnant and no prior DVT/PE   Back Pain Associated symptoms: chest pain   Associated symptoms: no abdominal pain, no dysuria, no fever, no headaches and no numbness     Past Medical History  Diagnosis Date  . Attention deficit disorder (ADD)   . Seasonal allergies    Past Surgical History  Procedure Laterality Date  . Rhinoplasty     No family history on file. History  Substance Use Topics  . Smoking status: Former Games developermoker  . Smokeless tobacco: Not on file  . Alcohol Use: No    Review of Systems  Constitutional: Negative for fever, diaphoresis, activity change and appetite change.  HENT: Negative for congestion, ear pain,  rhinorrhea, sinus pressure and sore throat.   Eyes: Negative for pain and redness.  Respiratory: Negative for cough, chest tightness and shortness of breath.   Cardiovascular: Positive for chest pain. Negative for palpitations, orthopnea, claudication, syncope, PND and near-syncope.  Gastrointestinal: Positive for heartburn. Negative for nausea, vomiting, abdominal pain, diarrhea, abdominal distention and anorexia.  Genitourinary: Negative for dysuria, flank pain and difficulty urinating.  Musculoskeletal: Negative for back pain, neck pain and neck stiffness.  Skin: Negative for rash and wound.  Neurological: Negative for dizziness, light-headedness, numbness and headaches.  Hematological: Negative for adenopathy.  Psychiatric/Behavioral: Negative for behavioral problems, confusion and agitation.      Allergies  Other; Oysters; Soybean-containing drug products; and Strawberry  Home Medications   Current Outpatient Rx  Name  Route  Sig  Dispense  Refill  . omeprazole (PRILOSEC) 20 MG capsule   Oral   Take 20 mg by mouth daily as needed (for reflux).           BP 123/88  Pulse 66  Temp(Src) 97.5 F (36.4 C) (Oral)  Resp 18  Wt 207 lb 7 oz (94.093 kg)  SpO2 100% Physical Exam  Constitutional: He is oriented to person, place, and time. He appears well-developed and well-nourished. No distress.  HENT:  Head: Normocephalic and atraumatic.  Nose: Nose normal.  Mouth/Throat: Oropharynx is clear and moist.  Eyes: Conjunctivae and EOM are normal. Pupils are equal, round, and reactive to light.  Neck: Normal range of motion. Neck supple. No tracheal deviation present.  Cardiovascular: Normal rate, regular rhythm, normal heart sounds and intact distal pulses.   Pulmonary/Chest: Effort normal and breath sounds normal. No respiratory distress. He has no rales.  Abdominal: Soft. Bowel sounds are normal. He exhibits no distension. There is no tenderness. There is no rebound and no  guarding.  Musculoskeletal: Normal range of motion. He exhibits no edema and no tenderness.  Neurological: He is alert and oriented to person, place, and time.  Skin: Skin is warm and dry.  Psychiatric: He has a normal mood and affect. His behavior is normal.    ED Course  Procedures (including critical care time) Labs Review Results for orders placed during the hospital encounter of 12/07/13  CBC      Result Value Ref Range   WBC 7.9  4.0 - 10.5 K/uL   RBC 6.07 (*) 4.22 - 5.81 MIL/uL   Hemoglobin 14.1  13.0 - 17.0 g/dL   HCT 16.1  09.6 - 04.5 %   MCV 70.8 (*) 78.0 - 100.0 fL   MCH 23.2 (*) 26.0 - 34.0 pg   MCHC 32.8  30.0 - 36.0 g/dL   RDW 40.9 (*) 81.1 - 91.4 %   Platelets 154  150 - 400 K/uL  BASIC METABOLIC PANEL      Result Value Ref Range   Sodium 141  137 - 147 mEq/L   Potassium 4.5  3.7 - 5.3 mEq/L   Chloride 101  96 - 112 mEq/L   CO2 30  19 - 32 mEq/L   Glucose, Bld 85  70 - 99 mg/dL   BUN 11  6 - 23 mg/dL   Creatinine, Ser 7.82  0.50 - 1.35 mg/dL   Calcium 9.4  8.4 - 95.6 mg/dL   GFR calc non Af Amer >90  >90 mL/min   GFR calc Af Amer >90  >90 mL/min  I-STAT TROPOININ, ED      Result Value Ref Range   Troponin i, poc 0.01  0.00 - 0.08 ng/mL   Comment 3            Imaging Review Dg Chest 2 View  12/07/2013   CLINICAL DATA:  Chest pain  EXAM: CHEST  2 VIEW  COMPARISON:  November 28, 2013  FINDINGS: Lungs are clear. Heart size and pulmonary vascularity are normal. No adenopathy. No pneumothorax. No bone lesions.  IMPRESSION: No abnormality noted.   Electronically Signed   By: Bretta Bang M.D.   On: 12/07/2013 11:33      MDM   Final diagnoses:  Chest pain  Otalgia    38 yo M in NAD AFVSS non toxic appearing who presents with atypical CP for years. CP begins in epigastric area and feels like a burning sensation. Improves with antacids. HEART score of 1. PERC low risk. EKG with no ischemic changes. Troponin wnl. Doubt ACS doubt PE. Case co managed with Dr.  Radford Pax. Thorough discussion with patient on plan , findings, return precautions. He has an appointment tomorrow morning with PCP. Return precautions given.     Nadara Mustard, MD 12/07/13 1225

## 2013-12-07 NOTE — Discharge Planning (Signed)
[facility] Felicia E, Community Liaison  Patient is a current orange [facility]ic[facility].  I spoke to patient about following with his pcp and educated patient on proper use of his orange card. Patient stated he does have an upcoming appointment with the clinic tomorrow 12/08/13 at 11:30. Patient was given my contact information for any future questions or concerns.

## 2013-12-07 NOTE — ED Notes (Signed)
Pt is here with right sided chest pain that is intermittent and reports upper mid back pain, states allergies are messing up.  Pt is diaphoretic at triage.

## 2013-12-08 ENCOUNTER — Encounter: Payer: Self-pay | Admitting: Internal Medicine

## 2013-12-08 ENCOUNTER — Ambulatory Visit: Payer: No Typology Code available for payment source | Attending: Internal Medicine | Admitting: Internal Medicine

## 2013-12-08 VITALS — BP 155/72 | HR 77 | Temp 98.9°F | Resp 14 | Ht 66.0 in | Wt 205.2 lb

## 2013-12-08 DIAGNOSIS — R109 Unspecified abdominal pain: Secondary | ICD-10-CM | POA: Insufficient documentation

## 2013-12-08 DIAGNOSIS — I10 Essential (primary) hypertension: Secondary | ICD-10-CM | POA: Insufficient documentation

## 2013-12-08 DIAGNOSIS — E785 Hyperlipidemia, unspecified: Secondary | ICD-10-CM

## 2013-12-08 DIAGNOSIS — R51 Headache: Secondary | ICD-10-CM | POA: Insufficient documentation

## 2013-12-08 DIAGNOSIS — R079 Chest pain, unspecified: Secondary | ICD-10-CM | POA: Insufficient documentation

## 2013-12-08 DIAGNOSIS — M549 Dorsalgia, unspecified: Secondary | ICD-10-CM | POA: Insufficient documentation

## 2013-12-08 LAB — LIPID PANEL
Cholesterol: 172 mg/dL (ref 0–200)
HDL: 32 mg/dL — AB (ref >39)
LDL CALC: 82 mg/dL (ref 0–99)
Total CHOL/HDL Ratio: 5.4 Ratio
Triglycerides: 291 mg/dL — ABNORMAL HIGH (ref <150)
VLDL: 58 mg/dL — AB (ref 0–40)

## 2013-12-08 MED ORDER — TRAMADOL HCL 50 MG PO TABS: 50.0000 mg | ORAL_TABLET | Freq: Four times a day (QID) | ORAL | Status: DC | PRN

## 2013-12-08 MED ORDER — OMEPRAZOLE 20 MG PO CPDR: 20.0000 mg | DELAYED_RELEASE_CAPSULE | Freq: Two times a day (BID) | ORAL | Status: DC

## 2013-12-08 MED ORDER — AMOXICILLIN-POT CLAVULANATE 875-125 MG PO TABS: 1.0000 | ORAL_TABLET | Freq: Two times a day (BID) | ORAL | Status: DC

## 2013-12-08 MED ORDER — HYDROXYZINE HCL 50 MG PO TABS: 50.0000 mg | ORAL_TABLET | Freq: Three times a day (TID) | ORAL | Status: DC | PRN

## 2013-12-08 NOTE — Patient Instructions (Signed)
Otitis Media, Adult Otitis media is redness, soreness, and swelling (inflammation) of the middle ear. Otitis media may be caused by allergies or, most commonly, by infection. Often it occurs as a complication of the common cold. SIGNS AND SYMPTOMS Symptoms of otitis media may include:  Earache.  Fever.  Ringing in your ear.  Headache.  Leakage of fluid from the ear. DIAGNOSIS To diagnose otitis media, your health care provider will examine your ear with an otoscope. This is an instrument that allows your health care provider to see into your ear in order to examine your eardrum. Your health care provider also will ask you questions about your symptoms. TREATMENT  Typically, otitis media resolves on its own within 3 5 days. Your health care provider may prescribe medicine to ease your symptoms of pain. If otitis media does not resolve within 5 days or is recurrent, your health care provider may prescribe antibiotic medicines if he or she suspects that a bacterial infection is the cause. HOME CARE INSTRUCTIONS   Take your medicine as directed until it is gone, even if you feel better after the first few days.  Only take over-the-counter or prescription medicines for pain, discomfort, or fever as directed by your health care provider.  Follow up with your health care provider as directed. SEEK MEDICAL CARE IF:  You have otitis media only in one ear or bleeding from your nose or both.  You notice a lump on your neck.  You are not getting better in 3 5 days.  You feel worse instead of better. SEEK IMMEDIATE MEDICAL CARE IF:   You have pain that is not controlled with medicine.  You have swelling, redness, or pain around your ear or stiffness in your neck.  You notice that part of your face is paralyzed.  You notice that the bone behind your ear (mastoid) is tender when you touch it. MAKE SURE YOU:   Understand these instructions.  Will watch your condition.  Will get help  right away if you are not doing well or get worse. Document Released: 06/29/2004 Document Revised: 07/15/2013 Document Reviewed: 04/21/2013 Exit[employer] Patient Information 2014 [employer].  

## 2013-12-08 NOTE — Progress Notes (Signed)
Patient is here to establish care. Patient suffers from seizures, not on medication now. Complains of ENT pain off and on all his life. Has a lot of allergies and mucus/nasal congestion. Also complains of abdominal pain x2 weeks; centered chest pain, upper back pain, and around bilateral sides of the abdomen; headaches, cold in chest and lungs.

## 2013-12-17 NOTE — Progress Notes (Signed)
Patient ID: Michael Daugherty, male   DOB: 03-12-1976, 38 y.o.   MRN: [id number]   CC: Followup  HPI: Patient presents to clinic for followup, denies chest pain or shortness of breath, no specific abdominal or urinary concerns. Needs a physical exam.  Allergies  Allergen Reactions  . Other Hives    Pecan nuts  . Oysters [Shellfish Allergy] Hives  . Soybean-Containing Drug Products Hives  . Strawberry Hives   Past Medical History  Diagnosis Date  . Attention deficit disorder (ADD)   . Seasonal allergies    No current outpatient prescriptions on file prior to visit.   No current facility-administered medications on file prior to visit.   Family History  Problem Relation Age of Onset  . Diabetes Mother   . Cancer Father   . Diabetes Brother    History   Social History  . Marital Status: Single    Spouse Name: N/A    Number of Children: N/A  . Years of Education: N/A   Occupational History  . Not on file.   Social History Main Topics  . Smoking status: Former Games developer  . Smokeless tobacco: Not on file  . Alcohol Use: No  . Drug Use: No  . Sexual Activity: Not on file   Other Topics Concern  . Not on file   Social History Narrative  . No narrative on file    Review of Systems  Constitutional: Negative for fever, chills, diaphoresis, activity change, appetite change and fatigue.  HENT: Negative for ear pain, nosebleeds, congestion, facial swelling, rhinorrhea, neck pain, neck stiffness and ear discharge.   Eyes: Negative for pain, discharge, redness, itching and visual disturbance.  Respiratory: Negative for cough, choking, chest tightness, shortness of breath, wheezing and stridor.   Cardiovascular: Negative for chest pain, palpitations and leg swelling.  Gastrointestinal: Negative for abdominal distention.  Genitourinary: Negative for dysuria, urgency, frequency, hematuria, flank pain, decreased urine volume, difficulty urinating and dyspareunia.   Musculoskeletal: Negative for back pain, joint swelling, arthralgias and gait problem.  Neurological: Negative for dizziness, tremors, seizures, syncope, facial asymmetry, speech difficulty, weakness, light-headedness, numbness and headaches.  Hematological: Negative for adenopathy. Does not bruise/bleed easily.  Psychiatric/Behavioral: Negative for hallucinations, behavioral problems, confusion, dysphoric mood, decreased concentration and agitation.    Objective:   Filed Vitals:   12/08/13 1125  BP: 155/72  Pulse: 77  Temp: 98.9 F (37.2 C)  Resp: 14    Physical Exam  Constitutional: Appears well-developed and well-nourished. No distress.  HENT: Normocephalic. External right and left ear normal. Oropharynx is clear and moist.  Eyes: Conjunctivae and EOM are normal. PERRLA, no scleral icterus.  Neck: Normal ROM. Neck supple. No JVD. No tracheal deviation. No thyromegaly.  CVS: RRR, S1/S2 +, no murmurs, no gallops, no carotid bruit.  Pulmonary: Effort and breath sounds normal, no stridor, rhonchi, wheezes, rales.  Abdominal: Soft. BS +,  no distension, tenderness, rebound or guarding.  Musculoskeletal: Normal range of motion. No edema and no tenderness.  Lymphadenopathy: No lymphadenopathy noted, cervical, inguinal. Neuro: Alert. Normal reflexes, muscle tone coordination. No cranial nerve deficit. Skin: Skin is warm and dry. No rash noted. Not diaphoretic. No erythema. No pallor.  Psychiatric: Normal mood and affect. Behavior, judgment, thought content normal.   Lab Results  Component Value Date   WBC 7.9 12/07/2013   HGB 14.1 12/07/2013   HCT 43.0 12/07/2013   MCV 70.8* 12/07/2013   PLT 154 12/07/2013   Lab Results  Component Value Date  CREATININE 1.01 12/07/2013   BUN 11 12/07/2013   NA 141 12/07/2013   K 4.5 12/07/2013   CL 101 12/07/2013   CO2 30 12/07/2013    No results found for this basename: HGBA1C   Lipid Panel     Component Value Date/Time   CHOL 172 12/08/2013 1214   TRIG  291* 12/08/2013 1214   HDL 32* 12/08/2013 1214   CHOLHDL 5.4 12/08/2013 1214   VLDL 58* 12/08/2013 1214   LDLCALC 82 12/08/2013 1214       Assessment and plan:   Hypertension - we discussed blood pressure being slightly elevated, recommendation is to check blood pressure regularly and to call us back if the numbers are persistently higher than 140/90 Triglycerides elevated - patient currently on gemfibrozil and needs to continue taking this medicine

## 2014-02-01 ENCOUNTER — Other Ambulatory Visit: Payer: Self-pay | Admitting: Emergency Medicine

## 2014-02-01 ENCOUNTER — Telehealth: Payer: Self-pay | Admitting: Internal Medicine

## 2014-02-01 DIAGNOSIS — J329 Chronic sinusitis, unspecified: Secondary | ICD-10-CM

## 2014-02-01 NOTE — Telephone Encounter (Signed)
Pt says that during last visit he asked about an Ear Nose and Throat referral but has not heard back about this yet. Referral does not appear in Epic, please f/u with pt.

## 2014-02-02 ENCOUNTER — Other Ambulatory Visit: Payer: Self-pay | Admitting: Internal Medicine

## 2014-02-02 ENCOUNTER — Telehealth: Payer: Self-pay | Admitting: Internal Medicine

## 2014-02-02 NOTE — Telephone Encounter (Signed)
Pt is calling in today to request refills on medications amoxicillin-clavulanate (AUGMENTIN) 875-125 MG per tablet & hydrOXYzine (ATARAX/VISTARIL) 50 MG tablet ;Pt stated that he is still experiencing pain in his ear from the infection; Please f/u with pt

## 2014-02-08 NOTE — Telephone Encounter (Signed)
Pt was wanting another appointment. I instructed the pt to walk in tomorrow due to schedule book up past his next appointment. Pt states that his ear is in severe pain and infected.

## 2014-02-09 ENCOUNTER — Other Ambulatory Visit: Payer: No Typology Code available for payment source

## 2014-02-09 NOTE — Progress Notes (Unsigned)
Patient presents with 2 week history of right ear pain. Rates 0/10 at present. States he's been taking Tramadol every 6 hours since Saturday and that is controlling the pain. Sates he thinks he has a right ear infection. Patient states he was told by a neighbor to put cotton in his ear when going outside. He stated he didn't have cotton balls so he shredded the end of cotton swabs and stuck that in his right ear two weeks ago and it is still there. Provider in to examine.  Bilateral ear irrigation performed as directed resulting with cerumen from left and 1cm x 0.5 cm cotton wad on right. Patient instructed to avoid putting cotton in ears in future. Advised patient that ear plugs can be purchased for future use. Provider in to re-examine. No ear infection present per provider.

## 2014-03-15 ENCOUNTER — Ambulatory Visit: Payer: No Typology Code available for payment source | Admitting: Internal Medicine

## 2014-04-17 ENCOUNTER — Emergency Department (HOSPITAL_COMMUNITY)
Admission: EM | Admit: 2014-04-17 | Discharge: 2014-04-17 | Disposition: A | Discharge status: Home / Self Care | Payer: No Typology Code available for payment source | Attending: Emergency Medicine | Admitting: Emergency Medicine

## 2014-04-17 ENCOUNTER — Encounter (HOSPITAL_COMMUNITY): Payer: Self-pay | Admitting: Emergency Medicine

## 2014-04-17 DIAGNOSIS — K089 Disorder of teeth and supporting structures, unspecified: Secondary | ICD-10-CM | POA: Insufficient documentation

## 2014-04-17 DIAGNOSIS — Z8659 Personal history of other mental and behavioral disorders: Secondary | ICD-10-CM | POA: Insufficient documentation

## 2014-04-17 DIAGNOSIS — K0889 Other specified disorders of teeth and supporting structures: Secondary | ICD-10-CM

## 2014-04-17 DIAGNOSIS — H65191 Other acute nonsuppurative otitis media, right ear: Secondary | ICD-10-CM

## 2014-04-17 DIAGNOSIS — Z87891 Personal history of nicotine dependence: Secondary | ICD-10-CM | POA: Insufficient documentation

## 2014-04-17 DIAGNOSIS — J3489 Other specified disorders of nose and nasal sinuses: Secondary | ICD-10-CM | POA: Insufficient documentation

## 2014-04-17 DIAGNOSIS — H66009 Acute suppurative otitis media without spontaneous rupture of ear drum, unspecified ear: Secondary | ICD-10-CM | POA: Insufficient documentation

## 2014-04-17 MED ORDER — HYDROCODONE-ACETAMINOPHEN 5-325 MG PO TABS: 1.0000 | ORAL_TABLET | Freq: Four times a day (QID) | ORAL | Status: DC | PRN

## 2014-04-17 MED ORDER — OXYCODONE-ACETAMINOPHEN 5-325 MG PO TABS
1.0000 | ORAL_TABLET | Freq: Once | ORAL | Status: AC
  Administered 2014-04-17: 1 via ORAL
  Filled 2014-04-17: qty 1

## 2014-04-17 MED ORDER — IBUPROFEN 600 MG PO TABS: 600.0000 mg | ORAL_TABLET | Freq: Four times a day (QID) | ORAL | Status: DC | PRN

## 2014-04-17 MED ORDER — AMOXICILLIN 500 MG PO CAPS: 500.0000 mg | ORAL_CAPSULE | Freq: Three times a day (TID) | ORAL | Status: DC

## 2014-04-17 MED ORDER — IBUPROFEN 400 MG PO TABS
800.0000 mg | ORAL_TABLET | Freq: Once | ORAL | Status: AC
  Administered 2014-04-17: 800 mg via ORAL
  Filled 2014-04-17: qty 2

## 2014-04-17 NOTE — Discharge Instructions (Signed)
Please call your doctor for a followup appointment within 24-48 hours. When you talk to your doctor please let them know that you were seen in the emergency department and have them acquire all of your records so that they can discuss the findings with you and formulate a treatment plan to fully care for your new and ongoing problems. Please call and set up an appointment with dentist for tooth to be removed Please call and set up an appointment with ear nose and throat physician regarding ear discomfort Please take antibiotics as prescribed Please take pain medications as prescribed-while on pain medications there is to be no drinking alcohol, driving, operating any heavy machinery if there is extra please disposer proper manner. Please do not take any extra Tylenol for this can be tunneled overdose and liver issues Please continue to monitor symptoms closely and if symptoms are to worsen or change (fever greater than 101, chills, chest pain, shortness of breath, difficulty breathing, numbness, tingling, swelling to the face, blurred vision, sudden loss of vision, neck pain, neck stiffness, neck swelling, worsening changes to pain symptoms, drainage the ear, bleeding from the ear, swelling to the back of the ear, pain upon palpation to the year) please report back to the ED immediately   Dental Pain A tooth ache may be caused by cavities (tooth decay). Cavities expose the nerve of the tooth to air and hot or cold temperatures. It may come from an infection or abscess (also called a boil or furuncle) around your tooth. It is also often caused by dental caries (tooth decay). This causes the pain you are having. DIAGNOSIS  Your caregiver can diagnose this problem by exam. TREATMENT   If caused by an infection, it may be treated with medications which kill germs (antibiotics) and pain medications as prescribed by your caregiver. Take medications as directed.  Only take over-the-counter or prescription  medicines for pain, discomfort, or fever as directed by your caregiver.  Whether the tooth ache today is caused by infection or dental disease, you should see your dentist as soon as possible for further care. SEEK MEDICAL CARE IF: The exam and treatment you received today has been provided on an emergency basis only. This is not a substitute for complete medical or dental care. If your problem worsens or new problems (symptoms) appear, and you are unable to meet with your dentist, call or return to this location. SEEK IMMEDIATE MEDICAL CARE IF:   You have a fever.  You develop redness and swelling of your face, jaw, or neck.  You are unable to open your mouth.  You have severe pain uncontrolled by pain medicine. MAKE SURE YOU:   Understand these instructions.  Will watch your condition.  Will get help right away if you are not doing well or get worse. Document Released: 09/24/2005 Document Revised: 12/17/2011 Document Reviewed: 05/12/2008 [facility] Patient Information 2015 [place]. This information is not intended to replace advice given to you by your health care provider. Make sure you discuss any questions you have with your health care provider. Otitis Media Otitis media is redness, soreness, and swelling (inflammation) of the middle ear. Otitis media may be caused by allergies or, most commonly, by infection. Often it occurs as a complication of the common cold. SIGNS AND SYMPTOMS Symptoms of otitis media may include:  Earache.  Fever.  Ringing in your ear.  Headache.  Leakage of fluid from the ear. DIAGNOSIS To diagnose otitis media, your health care provider will  examine your ear with an otoscope. This is an instrument that allows your health care provider to see into your ear in order to examine your eardrum. Your health care provider also will ask you questions about your symptoms. TREATMENT  Typically, otitis media resolves on its own within 3-5 days. Your  health care provider may prescribe medicine to ease your symptoms of pain. If otitis media does not resolve within 5 days or is recurrent, your health care provider may prescribe antibiotic medicines if he or she suspects that a bacterial infection is the cause. HOME CARE INSTRUCTIONS   Take your medicine as directed until it is gone, even if you feel better after the first few days.  Only take over-the-counter or prescription medicines for pain, discomfort, or fever as directed by your health care provider.  Follow up with your health care provider as directed. SEEK MEDICAL CARE IF:  You have otitis media only in one ear, or bleeding from your nose, or both.  You notice a lump on your neck.  You are not getting better in 3-5 days.  You feel worse instead of better. SEEK IMMEDIATE MEDICAL CARE IF:   You have pain that is not controlled with medicine.  You have swelling, redness, or pain around your ear or stiffness in your neck.  You notice that part of your face is paralyzed.  You notice that the bone behind your ear (mastoid) is tender when you touch it. MAKE SURE YOU:   Understand these instructions.  Will watch your condition.  Will get help right away if you are not doing well or get worse. Document Released: 06/29/2004 Document Revised: 09/29/2013 Document Reviewed: 04/21/2013 [facility] Patient Information 2015 [place], [place]. This information is not intended to replace advice given to you by your health care provider. Make sure you discuss any questions you have with your health care provider.

## 2014-04-17 NOTE — Progress Notes (Signed)
ED CM consulted by Laveda NormanM. Sciacca PA-C concerning f/u with PCP. [facility] and [facility]. [facility] and is a patient [facility] a patient of Dr. Hyman HopesJegede. Pt [facility]. Discussed with M. Sciacca PA-C made aware. No further CM needs identified

## 2014-04-17 NOTE — ED Notes (Signed)
PT has taken Advil for pain without relief.

## 2014-04-17 NOTE — ED Notes (Signed)
Declined W/C at D/C and was escorted to lobby by RN. 

## 2014-04-17 NOTE — ED Provider Notes (Signed)
CSN: [id number]     Arrival date & time 04/17/14  1318 History  This chart was scribed for non-physician practitioner, Raymon Mutton, PA-C,working with Dagmar Hait, MD, by Karle Plumber, ED Scribe.  This patient was seen in room TR09C/TR09C and the patient's care was started at 2:04 PM.  Chief Complaint  Patient presents with  . Dental Pain  . Otalgia   The history is provided by the patient. No language interpreter was used.   HPI Comments:  Michael Daugherty is a 38 y.o. male with h/o ADD and seasonal allergies who presents to the Emergency Department complaining of worsening moderate right ear pain and front right tooth pain that both started last night. He states the pain of both the tooth and the ear have been intermittently hurting for the past 2-3 months but got worse yesterday. He reports the pain also causes pain in his nose. He describes the tooth pain as sharp, shooting, and pressure and states that the tooth is chipped. He describes the ear pain as sharp and shooting. He reports taking OTC Ibuprofen and Tylenol with no relief. Hot/cold liquids or air do not make the tooth pain better or worse. He reports h/o seasonal allergies and reports some mild congestions and reports increased blowing of the nose. He denies neck pain, neck stiffness, fever, chills, SOB, CP, facial pain or swelling, bleeding or drainage of the tooth or ear, dizziness, blurred vision, loss of vision. He denies any recent swimming or getting water into the ear.   Past Medical History  Diagnosis Date  . Attention deficit disorder (ADD)   . Seasonal allergies    Past Surgical History  Procedure Laterality Date  . Rhinoplasty     Family History  Problem Relation Age of Onset  . Diabetes Mother   . Cancer Father   . Diabetes Brother    History  Substance Use Topics  . Smoking status: Former Games developer  . Smokeless tobacco: Not on file  . Alcohol Use: No    Review of Systems  Constitutional:  Negative for fever and chills.  HENT: Positive for congestion and ear pain. Negative for ear discharge, facial swelling and trouble swallowing.   Eyes: Negative for visual disturbance.  Respiratory: Negative for shortness of breath.   Cardiovascular: Negative for chest pain.  Musculoskeletal: Negative for back pain and neck pain.  Allergic/Immunologic: Positive for environmental allergies.  Neurological: Negative for dizziness.    Allergies  Other; Oysters; Soybean-containing drug products; and Strawberry  Home Medications   Prior to Admission medications   Medication Sig Start Date End Date Taking? Authorizing Provider  acetaminophen (TYLENOL) 500 MG tablet Take 500 mg by mouth every 6 (six) hours as needed for mild pain.   Yes Historical Provider, MD  amoxicillin (AMOXIL) 500 MG capsule Take 1 capsule (500 mg total) by mouth 3 (three) times daily. 04/17/14   Arianna Haydon, PA-C  HYDROcodone-acetaminophen (NORCO/VICODIN) 5-325 MG per tablet Take 1 tablet by mouth every 6 (six) hours as needed for moderate pain or severe pain. 04/17/14   Dinita Migliaccio, PA-C  ibuprofen (ADVIL,MOTRIN) 600 MG tablet Take 1 tablet (600 mg total) by mouth every 6 (six) hours as needed. 04/17/14   Lakynn Halvorsen, PA-C   Triage Vitals: BP 146/91  Pulse 73  Temp(Src) 98 F (36.7 C) (Oral)  Resp 20  Ht 5\' 6"  (1.676 m)  Wt 205 lb (92.987 kg)  BMI 33.10 kg/m2  SpO2 97% Physical Exam  Nursing note and vitals reviewed.  Constitutional: He is oriented to person, place, and time. He appears well-developed and well-nourished.  HENT:  Head: Normocephalic and atraumatic.  Right Ear: Hearing, external ear and ear canal normal. No lacerations. No drainage, swelling or tenderness. No foreign bodies. No mastoid tenderness. Tympanic membrane is injected. Tympanic membrane is not scarred, not perforated, not erythematous, not retracted and not bulging. No middle ear effusion. No hemotympanum. No decreased hearing is  noted.  Left Ear: Hearing, external ear and ear canal normal. No lacerations. No drainage, swelling or tenderness. No foreign bodies. No mastoid tenderness. Tympanic membrane is not injected, not scarred, not perforated, not erythematous, not retracted and not bulging.  No middle ear effusion. No hemotympanum. No decreased hearing is noted.  Mouth/Throat: No oropharyngeal exudate.  Diagrammed right central incisor of the maxillary jawline with active discoloration noted of a brownish color. Negative swelling, erythema, formation, lesions, sores, drainage noted to the gumline. Discomfort upon palpation. Negative drainable abscess identified. Poor dentition noted with numerous teeth missing, decaying, dental caries noted. Negative trismus. Uvula midline with symmetrical elevation. Negative uvula deviation. Negative sublingual lesions  Eyes: Conjunctivae and EOM are normal. Pupils are equal, round, and reactive to light. Right eye exhibits no discharge. Left eye exhibits no discharge.  Neck: Normal range of motion. Neck supple. No tracheal deviation present.  Negative neck stiffness Negative rigidity Negative cervical lymphadenopathy Negative meningeal signs  Cardiovascular: Normal rate, regular rhythm and normal heart sounds.  Exam reveals no friction rub.   No murmur heard. Pulses:      Radial pulses are 2+ on the right side, and 2+ on the left side.  Pulmonary/Chest: Effort normal and breath sounds normal. No respiratory distress. He has no wheezes. He has no rales.  Patient is able to speak in full sentences without difficulty Negative use of accessory muscles Negative stridor Negative active drooling   Musculoskeletal: Normal range of motion.  Full ROM to upper and lower extremities without difficulty noted, negative ataxia noted.  Lymphadenopathy:    He has no cervical adenopathy.  Neurological: He is alert and oriented to person, place, and time. No cranial nerve deficit. He exhibits  normal muscle tone. Coordination normal.  Cranial nerves III-XII grossly intact Negative facial drooping Negative slurred speech Negative aphasia  Skin: Skin is warm and dry.  Psychiatric: He has a normal mood and affect. His behavior is normal.    ED Course  Procedures (including critical care time) DIAGNOSTIC STUDIES: Oxygen Saturation is 97% on RA, normal by my interpretation.   COORDINATION OF CARE: 2:16 PM- Will prescribe antibiotics and give referrals to ENT and dentist. Will have care manager speak to pt about recertification of his [facility] discount card. Pt verbalizes understanding and agrees to plan.  Medications  oxyCODONE-acetaminophen (PERCOCET/ROXICET) 5-325 MG per tablet 1 tablet (1 tablet Oral Given 04/17/14 1342)  ibuprofen (ADVIL,MOTRIN) tablet 800 mg (800 mg Oral Given 04/17/14 1607)    Labs Review Labs Reviewed  POC URINE PREG, ED    Imaging Review No results found.   EKG Interpretation None      MDM   Final diagnoses:  Pain, dental  Acute nonsuppurative otitis media of right ear    Medications  oxyCODONE-acetaminophen (PERCOCET/ROXICET) 5-325 MG per tablet 1 tablet (1 tablet Oral Given 04/17/14 1342)  ibuprofen (ADVIL,MOTRIN) tablet 800 mg (800 mg Oral Given 04/17/14 1607)   Filed Vitals:   04/17/14 1322 04/17/14 1605  BP: 146/91 131/74  Pulse: 73 61  Temp: 98 F (36.7 C)  TempSrc: Oral   Resp: 20 18  Height: 5\' 6"  (1.676 m)   Weight: 205 lb (92.987 kg)   SpO2: 97% 99%   I personally performed the services described in this documentation, which was scribed in my presence. The recorded information has been reviewed and is accurate.  Patient presenting to the ED with dental pain does been ongoing intermittently but worsening yesterday. Patient reports he's been using ibuprofen and Tylenol with minimal relief. Reported he chipped his tooth while ago. Poor dentition noted with numerous teeth missing and decaying-right central incisor of the  maxillary jawline identified to be diagrammed in decaying process. Discomfort upon palpation. Negative abnormalities or deformities noted to the gumline. Negative active drainage or bleeding noted. Negative drainable abscess noted this time. TM of right ear noted to be erythematous with negative bulging, opaque fluid identified. Negative swelling to the canal. Hearing intact. Doubt peritonsillar abscess. Doubt retropharyngeal abscess. Doubt mastoiditis. Suspicion of erythematous TM secondary to dental pain-cannot rule out possible otitis media. Patient stable, afebrile. Patient not septic appearing. Discharged patient. Referred patient to [facility], dentist, ENT. Discussed with patient to apply cool compressions. Discussed with patient to avoid any physical strenuous activity. Discharge patient with antibiotics and small dose of pain medications-discussed course, cautions, disposal technique. Discussed with patient to closely monitor symptoms and if symptoms are to worsen or change to report back to the ED - strict return instructions given.  Patient agreed to plan of care, understood, all questions answered.   Raymon MuttonMarissa Markeita Alicia, PA-C 04/17/14 1839

## 2014-04-17 NOTE — ED Notes (Signed)
Pt is here with right ear pain and right upper tooth at front

## 2014-04-18 NOTE — ED Provider Notes (Signed)
Medical screening examination/treatment/procedure(s) were performed by non-physician practitioner and as supervising physician I was immediately available for consultation/collaboration.   EKG Interpretation None        Dagmar HaitWilliam Lucion Dilger, MD 04/18/14 1526

## 2014-04-26 ENCOUNTER — Ambulatory Visit: Payer: No Typology Code available for payment source | Attending: Internal Medicine

## 2014-05-19 ENCOUNTER — Emergency Department (HOSPITAL_COMMUNITY)
Admission: EM | Admit: 2014-05-19 | Discharge: 2014-05-19 | Disposition: A | Discharge status: Home / Self Care | Payer: No Typology Code available for payment source | Attending: Emergency Medicine | Admitting: Emergency Medicine

## 2014-05-19 ENCOUNTER — Encounter (HOSPITAL_COMMUNITY): Payer: Self-pay | Admitting: Emergency Medicine

## 2014-05-19 DIAGNOSIS — S161XXA Strain of muscle, fascia and tendon at neck level, initial encounter: Secondary | ICD-10-CM

## 2014-05-19 DIAGNOSIS — S3981XA Other specified injuries of abdomen, initial encounter: Secondary | ICD-10-CM | POA: Diagnosis not present

## 2014-05-19 DIAGNOSIS — IMO0002 Reserved for concepts with insufficient information to code with codable children: Secondary | ICD-10-CM | POA: Diagnosis not present

## 2014-05-19 DIAGNOSIS — S0990XA Unspecified injury of head, initial encounter: Secondary | ICD-10-CM | POA: Insufficient documentation

## 2014-05-19 DIAGNOSIS — Y92410 Unspecified street and highway as the place of occurrence of the external cause: Secondary | ICD-10-CM | POA: Insufficient documentation

## 2014-05-19 DIAGNOSIS — S199XXA Unspecified injury of neck, initial encounter: Secondary | ICD-10-CM

## 2014-05-19 DIAGNOSIS — Y9389 Activity, other specified: Secondary | ICD-10-CM | POA: Insufficient documentation

## 2014-05-19 DIAGNOSIS — S298XXA Other specified injuries of thorax, initial encounter: Secondary | ICD-10-CM | POA: Insufficient documentation

## 2014-05-19 DIAGNOSIS — S139XXA Sprain of joints and ligaments of unspecified parts of neck, initial encounter: Secondary | ICD-10-CM | POA: Insufficient documentation

## 2014-05-19 DIAGNOSIS — S0993XA Unspecified injury of face, initial encounter: Secondary | ICD-10-CM | POA: Diagnosis present

## 2014-05-19 DIAGNOSIS — Z8659 Personal history of other mental and behavioral disorders: Secondary | ICD-10-CM | POA: Insufficient documentation

## 2014-05-19 DIAGNOSIS — R519 Headache, unspecified: Secondary | ICD-10-CM

## 2014-05-19 DIAGNOSIS — Z87891 Personal history of nicotine dependence: Secondary | ICD-10-CM | POA: Diagnosis not present

## 2014-05-19 DIAGNOSIS — R51 Headache: Secondary | ICD-10-CM

## 2014-05-19 MED ORDER — CYCLOBENZAPRINE HCL 10 MG PO TABS: 10.0000 mg | ORAL_TABLET | Freq: Two times a day (BID) | ORAL | Status: DC | PRN

## 2014-05-19 MED ORDER — ACETAMINOPHEN 325 MG PO TABS
325.0000 mg | ORAL_TABLET | Freq: Once | ORAL | Status: AC
  Administered 2014-05-19: 325 mg via ORAL
  Filled 2014-05-19: qty 1

## 2014-05-19 MED ORDER — IBUPROFEN 600 MG PO TABS: 600.0000 mg | ORAL_TABLET | Freq: Four times a day (QID) | ORAL | Status: DC | PRN

## 2014-05-19 NOTE — ED Notes (Signed)
Pt verbalizes understanding of d/c instructions and denies any further needs at this time. 

## 2014-05-19 NOTE — ED Notes (Signed)
Pt presents to department for evaluation of headache and back pain. [facility] now states he is sore all over body. Pt is alert and oriented x4. Ambulatory to triage. NAD.
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
## 2014-05-20 NOTE — ED Provider Notes (Signed)
Medical screening examination/treatment/procedure(s) were performed by non-physician practitioner and as supervising physician I was immediately available for consultation/collaboration.   EKG Interpretation None        Demaris Bousquet, DO 05/20/14 1649 

## 2014-08-24 ENCOUNTER — Ambulatory Visit: Payer: Self-pay | Attending: Internal Medicine | Admitting: Internal Medicine

## 2014-08-24 ENCOUNTER — Encounter: Payer: Self-pay | Admitting: Internal Medicine

## 2014-08-24 ENCOUNTER — Ambulatory Visit (HOSPITAL_BASED_OUTPATIENT_CLINIC_OR_DEPARTMENT_OTHER): Payer: No Typology Code available for payment source

## 2014-08-24 VITALS — BP 129/89 | HR 73 | Temp 98.0°F | Resp 16

## 2014-08-24 DIAGNOSIS — H6123 Impacted cerumen, bilateral: Secondary | ICD-10-CM | POA: Insufficient documentation

## 2014-08-24 DIAGNOSIS — R0981 Nasal congestion: Secondary | ICD-10-CM | POA: Insufficient documentation

## 2014-08-24 DIAGNOSIS — E785 Hyperlipidemia, unspecified: Secondary | ICD-10-CM | POA: Insufficient documentation

## 2014-08-24 DIAGNOSIS — Z23 Encounter for immunization: Secondary | ICD-10-CM | POA: Insufficient documentation

## 2014-08-24 DIAGNOSIS — Z139 Encounter for screening, unspecified: Secondary | ICD-10-CM

## 2014-08-24 DIAGNOSIS — R1013 Epigastric pain: Secondary | ICD-10-CM | POA: Insufficient documentation

## 2014-08-24 DIAGNOSIS — Z91048 Other nonmedicinal substance allergy status: Secondary | ICD-10-CM

## 2014-08-24 DIAGNOSIS — H6120 Impacted cerumen, unspecified ear: Secondary | ICD-10-CM | POA: Insufficient documentation

## 2014-08-24 DIAGNOSIS — Z87891 Personal history of nicotine dependence: Secondary | ICD-10-CM | POA: Insufficient documentation

## 2014-08-24 DIAGNOSIS — Z9109 Other allergy status, other than to drugs and biological substances: Secondary | ICD-10-CM | POA: Insufficient documentation

## 2014-08-24 DIAGNOSIS — Z79899 Other long term (current) drug therapy: Secondary | ICD-10-CM | POA: Insufficient documentation

## 2014-08-24 MED ORDER — CARBAMIDE PEROXIDE 6.5 % OT SOLN: 5.0000 [drp] | Freq: Two times a day (BID) | OTIC | Status: DC

## 2014-08-24 MED ORDER — OMEPRAZOLE 20 MG PO CPDR: 20.0000 mg | DELAYED_RELEASE_CAPSULE | Freq: Every day | ORAL | Status: DC

## 2014-08-24 MED ORDER — CETIRIZINE HCL 10 MG PO TABS: 10.0000 mg | ORAL_TABLET | Freq: Every day | ORAL | Status: DC

## 2014-08-24 MED ORDER — FLUTICASONE PROPIONATE 50 MCG/ACT NA SUSP: 2.0000 | Freq: Every day | NASAL | Status: DC

## 2014-08-24 NOTE — Progress Notes (Signed)
Patient complains of right ear pain States his allergies have been bothering him Has been itchy -denies any changes in his soaps Has been feeling bloated and gassy

## 2014-08-24 NOTE — Progress Notes (Signed)
MRN: [id number] Name: Michael Daugherty  Sex: male Age: 38 y.o. DOB: [birth year]  Allergies: Other; Oysters; Soybean-containing drug products; and Strawberry  Chief Complaint  Patient presents with  . Ear Pain    right    HPI: Patient is 38 y.o. male who history of hyperlipidemia comes today reported to have lot of environmental allergies stuffy nose ear pain, denies any fever chills chest and shortness of breath, he also reports a lot of bloating symptoms comes as well as reflux symptoms, he tried over-the-counter medications without much improvement.patient used to be on cholesterol medication in the past as per patient is not taking any.  Past Medical History  Diagnosis Date  . Attention deficit disorder (ADD)   . Seasonal allergies     Past Surgical History  Procedure Laterality Date  . Rhinoplasty        Medication List       This list is accurate as of: 08/24/14  9:37 AM.  Always use your most recent med list.               carbamide peroxide 6.5 % otic solution  Commonly known as:  DEBROX  Place 5 drops into both ears 2 (two) times daily.     cetirizine 10 MG tablet  Commonly known as:  ZYRTEC  Take 1 tablet (10 mg total) by mouth daily.     cyclobenzaprine 10 MG tablet  Commonly known as:  FLEXERIL  Take 1 tablet (10 mg total) by mouth 2 (two) times daily as needed for muscle spasms.     fluticasone 50 MCG/ACT nasal spray  Commonly known as:  FLONASE  Place 2 sprays into both nostrils daily.     ibuprofen 600 MG tablet  Commonly known as:  ADVIL,MOTRIN  Take 1 tablet (600 mg total) by mouth every 6 (six) hours as needed.     omeprazole 20 MG capsule  Commonly known as:  PRILOSEC  Take 1 capsule (20 mg total) by mouth daily.        Meds ordered this encounter  Medications  . cetirizine (ZYRTEC) 10 MG tablet    Sig: Take 1 tablet (10 mg total) by mouth daily.    Dispense:  30 tablet    Refill:  3  . carbamide peroxide (DEBROX) 6.5 % otic  solution    Sig: Place 5 drops into both ears 2 (two) times daily.    Dispense:  15 mL    Refill:  1  . fluticasone (FLONASE) 50 MCG/ACT nasal spray    Sig: Place 2 sprays into both nostrils daily.    Dispense:  16 g    Refill:  6  . omeprazole (PRILOSEC) 20 MG capsule    Sig: Take 1 capsule (20 mg total) by mouth daily.    Dispense:  30 capsule    Refill:  3    Immunization History  Administered Date(s) Administered  . Influenza,inj,Quad PF,36+ Mos 08/24/2014    Family History  Problem Relation Age of Onset  . Diabetes Mother   . Cancer Father   . Diabetes Brother     History  Substance Use Topics  . Smoking status: Former Games developer  . Smokeless tobacco: Not on file  . Alcohol Use: No    Review of Systems   As noted in HPI  Filed Vitals:   08/24/14 0858  BP: 129/89  Pulse: 73  Temp: 98 F (36.7 C)  Resp: 16    Physical Exam  Physical Exam  Constitutional: No distress.  HENT:  Increased wax in both ears, no tenderness with pressure on tragus .  Nasal congestion no sinus tenderness   Eyes: EOM are normal. Pupils are equal, round, and reactive to light.  Cardiovascular: Normal rate and regular rhythm.   Pulmonary/Chest: Breath sounds normal. No respiratory distress. He has no wheezes. He has no rales.  Abdominal: Soft. There is no tenderness. There is no rebound and no guarding.  Musculoskeletal: He exhibits no edema.    CBC    Component Value Date/Time   WBC 7.9 12/07/2013 1103   RBC 6.07* 12/07/2013 1103   HGB 14.1 12/07/2013 1103   HCT 43.0 12/07/2013 1103   PLT 154 12/07/2013 1103   MCV 70.8* 12/07/2013 1103    CMP     Component Value Date/Time   NA 141 12/07/2013 1103   K 4.5 12/07/2013 1103   CL 101 12/07/2013 1103   CO2 30 12/07/2013 1103   GLUCOSE 85 12/07/2013 1103   BUN 11 12/07/2013 1103   CREATININE 1.01 12/07/2013 1103   CALCIUM 9.4 12/07/2013 1103   GFRNONAA >90 12/07/2013 1103   GFRAA >90 12/07/2013 1103    Lab Results    Component Value Date/Time   CHOL 172 12/08/2013 12:14 PM    No components found for: HGA1C  No results found for: AST  Assessment and Plan  Environmental allergies - Plan: cetirizine (ZYRTEC) 10 MG tablet  Excess ear wax, bilateral - Plan: carbamide peroxide (DEBROX) 6.5 % otic solution  Dyspepsia - Plan: advised patient for lifestyle modification, trial of omeprazole (PRILOSEC) 20 MG capsule  Nasal congestion - Plan: fluticasone (FLONASE) 50 MCG/ACT nasal spray  HLD (hyperlipidemia) - Plan:advised patient for low fat diet, will check fasting Lipid panel  Screening - Plan: COMPLETE METABOLIC PANEL WITH GFR, Vit D  25 hydroxy (rtn osteoporosis monitoring)  Needs flu shot Flu shot given today.  Health Maintenance Flu shot given today   Return in about 3 months (around 11/24/2014).  Doris Cheadle, MD

## 2014-09-03 ENCOUNTER — Emergency Department (HOSPITAL_COMMUNITY)
Admission: EM | Admit: 2014-09-03 | Discharge: 2014-09-03 | Disposition: A | Discharge status: Home / Self Care | Payer: Self-pay | Attending: Emergency Medicine | Admitting: Emergency Medicine

## 2014-09-03 ENCOUNTER — Encounter (HOSPITAL_COMMUNITY): Payer: Self-pay | Admitting: *Deleted

## 2014-09-03 DIAGNOSIS — Y9289 Other specified places as the place of occurrence of the external cause: Secondary | ICD-10-CM | POA: Insufficient documentation

## 2014-09-03 DIAGNOSIS — S00451A Superficial foreign body of right ear, initial encounter: Secondary | ICD-10-CM

## 2014-09-03 DIAGNOSIS — X58XXXA Exposure to other specified factors, initial encounter: Secondary | ICD-10-CM | POA: Insufficient documentation

## 2014-09-03 DIAGNOSIS — T161XXA Foreign body in right ear, initial encounter: Secondary | ICD-10-CM | POA: Insufficient documentation

## 2014-09-03 DIAGNOSIS — J302 Other seasonal allergic rhinitis: Secondary | ICD-10-CM | POA: Insufficient documentation

## 2014-09-03 DIAGNOSIS — F909 Attention-deficit hyperactivity disorder, unspecified type: Secondary | ICD-10-CM | POA: Insufficient documentation

## 2014-09-03 DIAGNOSIS — Y998 Other external cause status: Secondary | ICD-10-CM | POA: Insufficient documentation

## 2014-09-03 DIAGNOSIS — Z87891 Personal history of nicotine dependence: Secondary | ICD-10-CM | POA: Insufficient documentation

## 2014-09-03 DIAGNOSIS — Y9389 Activity, other specified: Secondary | ICD-10-CM | POA: Insufficient documentation

## 2014-09-03 DIAGNOSIS — Z79899 Other long term (current) drug therapy: Secondary | ICD-10-CM | POA: Insufficient documentation

## 2014-09-03 DIAGNOSIS — Z7951 Long term (current) use of inhaled steroids: Secondary | ICD-10-CM | POA: Insufficient documentation

## 2014-09-03 MED ORDER — NEOMYCIN-POLYMYXIN-HC 3.5-10000-1 OT SOLN: 3.0000 [drp] | Freq: Three times a day (TID) | OTIC | Status: DC

## 2014-09-03 NOTE — Discharge Instructions (Signed)
Only use the ear drops if you begin ti have pain in your ear.  Ear Foreign Body An ear foreign body is an object that is stuck in the ear. It is common for young children to put objects into the ear canal. These may include pebbles, beads, beans, and any other small objects which will fit. In adults, objects such as cotton swabs may become lodged in the ear canal. In all ages, the most common foreign bodies are insects that enter the ear canal.  SYMPTOMS  Foreign bodies may cause pain, buzzing or roaring sounds, hearing loss, and ear drainage.  HOME CARE INSTRUCTIONS   Keep all follow-up appointments with your caregiver as told.  Keep small objects out of reach of young children. Tell them not to put anything in their ears. SEEK IMMEDIATE MEDICAL CARE IF:   You have bleeding from the ear.  You have increased pain or swelling of the ear.  You have reduced hearing.  You have discharge coming from the ear.  You have a fever.  You have a headache. MAKE SURE YOU:   Understand these instructions.  Will watch your condition.  Will get help right away if you are not doing well or get worse. Document Released: 09/21/2000 Document Revised: 12/17/2011 Document Reviewed: 05/12/2008 [facility] Patient Information 2015 [place], [place]. This information is not intended to replace advice given to you by your health care provider. Make sure you discuss any questions you have with your health care provider.

## 2014-09-03 NOTE — ED Provider Notes (Signed)
CSN: [id number]     Arrival date & time 09/03/14  1316 History  This chart was scribed for non-physician practitioner, Arthor CaptainAbigail Terrye Dombrosky, PA-C working with Lyanne CoKevin M Campos, MD by Luisa DagoPriscilla Tutu, ED scribe. This patient was seen in room TR07C/TR07C and the patient's care was started at 1:45 PM.      Chief Complaint  Patient presents with  . Otalgia   The history is provided by the patient. No language interpreter was used.   HPI Comments: Michael Daugherty is a 38 y.o. male who presents to the Emergency Department complaining of right ear congestion with onset 1 week  ago. Pt states that "everything sounds muffled". He states that he was recently treated for similar symptoms and was told that he was wax build up in his right eyes. Pt states that his PCP prescribed him some ear drops, but never washed the ear out. Denies any right sided otalgia, fever, chills, nausea, emesis, or SOB.  Past Medical History  Diagnosis Date  . Attention deficit disorder (ADD)   . Seasonal allergies    Past Surgical History  Procedure Laterality Date  . Rhinoplasty     Family History  Problem Relation Age of Onset  . Diabetes Mother   . Cancer Father   . Diabetes Brother    History  Substance Use Topics  . Smoking status: Former Games developermoker  . Smokeless tobacco: Not on file  . Alcohol Use: No    Review of Systems  Constitutional: Negative for fever and chills.  HENT: Negative for congestion and ear pain.   Respiratory: Negative for cough and shortness of breath.   Cardiovascular: Negative for chest pain and leg swelling.  Gastrointestinal: Negative for nausea and vomiting.   Allergies  Other; Oysters; Soybean-containing drug products; and Strawberry  Home Medications   Prior to Admission medications   Medication Sig Start Date End Date Taking? Authorizing Provider  carbamide peroxide (DEBROX) 6.5 % otic solution Place 5 drops into both ears 2 (two) times daily. 08/24/14   Doris Cheadleeepak Advani, MD   cetirizine (ZYRTEC) 10 MG tablet Take 1 tablet (10 mg total) by mouth daily. 08/24/14   Doris Cheadleeepak Advani, MD  cyclobenzaprine (FLEXERIL) 10 MG tablet Take 1 tablet (10 mg total) by mouth 2 (two) times daily as needed for muscle spasms. 05/19/14   Tatyana A Kirichenko, PA-C  fluticasone (FLONASE) 50 MCG/ACT nasal spray Place 2 sprays into both nostrils daily. 08/24/14   Doris Cheadleeepak Advani, MD  ibuprofen (ADVIL,MOTRIN) 600 MG tablet Take 1 tablet (600 mg total) by mouth every 6 (six) hours as needed. 05/19/14   Tatyana A Kirichenko, PA-C  omeprazole (PRILOSEC) 20 MG capsule Take 1 capsule (20 mg total) by mouth daily. 08/24/14   Doris Cheadleeepak Advani, MD   Triage Vitals: BP 152/83 mmHg  Pulse 82  Temp(Src) 98.5 F (36.9 C) (Oral)  Resp 20  SpO2 100%  Physical Exam  Constitutional: He is oriented to person, place, and time. He appears well-developed and well-nourished. No distress.  HENT:  Head: Normocephalic and atraumatic.  There is cotton in the right canal. No pain with movent of the pinna. No mastoid tenderness.  Eyes: Conjunctivae are normal. Right eye exhibits no discharge. Left eye exhibits no discharge.  Neck: Neck supple.  Cardiovascular: Normal rate, regular rhythm and normal heart sounds.  Exam reveals no gallop and no friction rub.   No murmur heard. Pulmonary/Chest: Effort normal and breath sounds normal. No respiratory distress.  Abdominal: Soft. He exhibits no distension. There is  no tenderness.  Musculoskeletal: He exhibits no edema or tenderness.  Lymphadenopathy:       Head (right side): No preauricular and no posterior auricular adenopathy present.       Head (left side): No preauricular and no posterior auricular adenopathy present.  Neurological: He is alert and oriented to person, place, and time.  Skin: Skin is warm and dry.  Psychiatric: He has a normal mood and affect. His behavior is normal. Thought content normal.  Nursing note and vitals reviewed.   ED Course  FOREIGN  BODY REMOVAL Date/Time: 09/03/2014 2:21 PM Performed by: Arthor CaptainHARRIS, Lamine Laton Authorized by: Arthor CaptainHARRIS, Laterra Lubinski Consent: Verbal consent obtained. Risks and benefits: risks, benefits and alternatives were discussed Patient identity confirmed: verbally with patient Time out: Immediately prior to procedure a "time out" was called to verify the correct patient, procedure, equipment, support staff and site/side marked as required. Body area: ear Location details: right ear Patient sedated: no Patient restrained: no Patient cooperative: yes Localization method: ENT speculum and visualized Removal mechanism: bayonet forceps 2 objects recovered. Objects recovered: cotton swabs Post-procedure assessment: foreign body removed Patient tolerance: Patient tolerated the procedure well with no immediate complications   (including critical care time)  DIAGNOSTIC STUDIES: Oxygen Saturation is 100% on RA, normal by my interpretation.    COORDINATION OF CARE: 1:59 PM- Pt advised of plan for treatment and pt agrees.  Labs Review Labs Reviewed - No data to display  Imaging Review No results found.   EKG Interpretation None      MDM   Final diagnoses:  Foreign body in ear lobe, right, initial encounter    FB in ear.TM is erythematous, but I feel this is likely due to irritation.  Will d/c with ear drops to hold if pain occurs. F/u with pcp  I personally performed the services described in this documentation, which was scribed in my presence. The recorded information has been reviewed and is accurate.    Arthor Captainbigail Emira Eubanks, PA-C 09/03/14 1435  Lyanne CoKevin M Campos, MD 09/04/14 [phone]

## 2014-09-03 NOTE — ED Notes (Signed)
Pt in c/o right congestion, states everything sounds muffled, recently seen somewhere for this and told he had wax in his ear, denies pain

## 2014-09-16 ENCOUNTER — Emergency Department (HOSPITAL_COMMUNITY)
Admission: EM | Admit: 2014-09-16 | Discharge: 2014-09-16 | Disposition: A | Discharge status: Home / Self Care | Payer: No Typology Code available for payment source | Attending: Emergency Medicine | Admitting: Emergency Medicine

## 2014-09-16 ENCOUNTER — Emergency Department (HOSPITAL_COMMUNITY): Payer: No Typology Code available for payment source

## 2014-09-16 ENCOUNTER — Encounter (HOSPITAL_COMMUNITY): Payer: Self-pay | Admitting: Cardiology

## 2014-09-16 DIAGNOSIS — Z7951 Long term (current) use of inhaled steroids: Secondary | ICD-10-CM | POA: Insufficient documentation

## 2014-09-16 DIAGNOSIS — M545 Low back pain: Secondary | ICD-10-CM | POA: Insufficient documentation

## 2014-09-16 DIAGNOSIS — Z8659 Personal history of other mental and behavioral disorders: Secondary | ICD-10-CM | POA: Insufficient documentation

## 2014-09-16 DIAGNOSIS — K047 Periapical abscess without sinus: Secondary | ICD-10-CM

## 2014-09-16 DIAGNOSIS — M549 Dorsalgia, unspecified: Secondary | ICD-10-CM

## 2014-09-16 DIAGNOSIS — G8929 Other chronic pain: Secondary | ICD-10-CM | POA: Insufficient documentation

## 2014-09-16 DIAGNOSIS — Z79899 Other long term (current) drug therapy: Secondary | ICD-10-CM | POA: Insufficient documentation

## 2014-09-16 DIAGNOSIS — R079 Chest pain, unspecified: Secondary | ICD-10-CM

## 2014-09-16 DIAGNOSIS — R109 Unspecified abdominal pain: Secondary | ICD-10-CM | POA: Insufficient documentation

## 2014-09-16 DIAGNOSIS — Z87891 Personal history of nicotine dependence: Secondary | ICD-10-CM | POA: Insufficient documentation

## 2014-09-16 LAB — BASIC METABOLIC PANEL
Anion gap: 11 (ref 5–15)
BUN: 9 mg/dL (ref 6–23)
CHLORIDE: 105 meq/L (ref 96–112)
CO2: 26 mEq/L (ref 19–32)
CREATININE: 0.98 mg/dL (ref 0.50–1.35)
Calcium: 9.3 mg/dL (ref 8.4–10.5)
GFR calc non Af Amer: >90 mL/min (ref >90)
GLUCOSE: 96 mg/dL (ref 70–99)
POTASSIUM: 4.5 meq/L (ref 3.7–5.3)
Sodium: 142 mEq/L (ref 137–147)

## 2014-09-16 LAB — CBC
HEMATOCRIT: 42.9 % (ref 39.0–52.0)
HEMOGLOBIN: 13.7 g/dL (ref 13.0–17.0)
MCH: 23.4 pg — ABNORMAL LOW (ref 26.0–34.0)
MCHC: 31.9 g/dL (ref 30.0–36.0)
MCV: 73.2 fL — ABNORMAL LOW (ref 78.0–100.0)
Platelets: 154 10*3/uL (ref 150–400)
RBC: 5.86 MIL/uL — AB (ref 4.22–5.81)
RDW: 15.8 % — ABNORMAL HIGH (ref 11.5–15.5)
WBC: 5 10*3/uL (ref 4.0–10.5)

## 2014-09-16 MED ORDER — PENICILLIN V POTASSIUM 250 MG PO TABS: 250.0000 mg | ORAL_TABLET | Freq: Four times a day (QID) | ORAL | Status: AC

## 2014-09-16 NOTE — ED Notes (Signed)
Pt reports that he has been having chest pain, abd pain and tooth pain for a long time. States that he came here because he could not get into see his PCP.

## 2014-09-16 NOTE — ED Provider Notes (Signed)
CSN: [id number]     Arrival date & time 09/16/14  1330 History   First MD Initiated Contact with Patient 09/16/14 1647     Chief Complaint  Patient presents with  . Chest Pain  . Abdominal Pain     (Consider location/radiation/quality/duration/timing/severity/associated sxs/prior Treatment) HPI Patient is a otherwise healthy 38 year old male who presents with multiple complaints. He complains of mild abdominal pain that he says feels like gas. He denies any radiation of this pain. He says it feels "bloating" and is mild in severity. He also complains of upper back pain, that he says feels like it is "tight" does not radiate, and is dull in nature. He says that this also is mild in severity, and began this morning after he got out of bed. He has had no fever, is not a dry IV drug user. He also complains of facial pain and tooth pain from one of his front incisors, that he says is been present for approximately 2-3 weeks. He has not had a fever. He says that he was going to go to his primary care doctor about these problems today, but they did not have any appointments, so they told him to come to the emergency department.  Though it is noted in the triage notes, upon my evaluation, the patient did not report any chest pain, and specifically denied it, he referred only to his upper back pain.  Past Medical History  Diagnosis Date  . Attention deficit disorder (ADD)   . Seasonal allergies    Past Surgical History  Procedure Laterality Date  . Rhinoplasty     Family History  Problem Relation Age of Onset  . Diabetes Mother   . Cancer Father   . Diabetes Brother    History  Substance Use Topics  . Smoking status: Former Games developermoker  . Smokeless tobacco: Not on file  . Alcohol Use: No    Review of Systems  Constitutional: Negative for fever.  Respiratory: Negative for cough and shortness of breath.   Gastrointestinal: Positive for abdominal pain. Negative for nausea, vomiting,  diarrhea, constipation, blood in stool, abdominal distention and anal bleeding.  Musculoskeletal: Positive for back pain. Negative for arthralgias and gait problem.  Neurological: Negative for dizziness, seizures, light-headedness, numbness and headaches.  All other systems reviewed and are negative.     Allergies  Other; Oysters; Soybean-containing drug products; and Strawberry  Home Medications   Prior to Admission medications   Medication Sig Start Date End Date Taking? Authorizing Provider  cetirizine (ZYRTEC) 10 MG tablet Take 1 tablet (10 mg total) by mouth daily. 08/24/14  Yes Deepak Advani, MD  fluticasone (FLONASE) 50 MCG/ACT nasal spray Place 2 sprays into both nostrils daily. 08/24/14  Yes Doris Cheadleeepak Advani, MD  omeprazole (PRILOSEC) 20 MG capsule Take 1 capsule (20 mg total) by mouth daily. 08/24/14  Yes Doris Cheadleeepak Advani, MD  carbamide peroxide (DEBROX) 6.5 % otic solution Place 5 drops into both ears 2 (two) times daily. Patient not taking: Reported on 09/16/2014 08/24/14   Doris Cheadleeepak Advani, MD  cyclobenzaprine (FLEXERIL) 10 MG tablet Take 1 tablet (10 mg total) by mouth 2 (two) times daily as needed for muscle spasms. Patient not taking: Reported on 09/16/2014 05/19/14   Tatyana A Kirichenko, PA-C  ibuprofen (ADVIL,MOTRIN) 600 MG tablet Take 1 tablet (600 mg total) by mouth every 6 (six) hours as needed. Patient not taking: Reported on 09/16/2014 05/19/14   Tatyana A Kirichenko, PA-C  neomycin-polymyxin-hydrocortisone (CORTISPORIN) otic solution Place 3 drops  into the right ear 3 (three) times daily. Patient not taking: Reported on 09/16/2014 09/03/14   Arthor CaptainAbigail Harris, PA-C   BP 120/72 mmHg  Pulse 66  Temp(Src) 98.4 F (36.9 C) (Oral)  Resp 20  Ht 5\' 6"  (1.676 m)  Wt 206 lb (93.441 kg)  BMI 33.27 kg/m2  SpO2 100% Physical Exam  Constitutional: He is oriented to person, place, and time. He appears well-developed and well-nourished. No distress.  HENT:  Head: Normocephalic and  atraumatic.  Mouth/Throat: Abnormal dentition. Dental abscesses (Frontal incisor) and dental caries present.  Eyes: EOM are normal. Pupils are equal, round, and reactive to light.  Neck: Normal range of motion. No thyromegaly present.  Cardiovascular: Normal rate and regular rhythm.   Pulmonary/Chest: Effort normal and breath sounds normal. No respiratory distress.  Abdominal: Soft. Bowel sounds are normal. He exhibits no distension.  Musculoskeletal: Normal range of motion.  Neurological: He is alert and oriented to person, place, and time.  Skin: Skin is warm and dry.  Psychiatric: He has a normal mood and affect.  Nursing note and vitals reviewed.   ED Course  Procedures (including critical care time) Labs Review Labs Reviewed  CBC - Abnormal; Notable for the following:    RBC 5.86 (*)    MCV 73.2 (*)    MCH 23.4 (*)    RDW 15.8 (*)    All other components within normal limits  BASIC METABOLIC PANEL  I-STAT TROPOININ, ED    Imaging Review Dg Chest 2 View  09/16/2014   CLINICAL DATA:  Chest pain.  EXAM: CHEST  2 VIEW  COMPARISON:  December 07, 2013.  FINDINGS: The heart size and mediastinal contours are within normal limits. Both lungs are clear. No pneumothorax or pleural effusion is noted. The visualized skeletal structures are unremarkable.  IMPRESSION: No acute cardiopulmonary abnormality seen.   Electronically Signed   By: Roque LiasJames  Green M.D.   On: 09/16/2014 15:35     EKG Interpretation   Date/Time:  Thursday September 16 2014 13:57:03 EST Ventricular Rate:  77 PR Interval:  138 QRS Duration: 92 QT Interval:  356 QTC Calculation: 402 R Axis:   80 Text Interpretation:  Normal sinus rhythm Normal ECG No significant change  since last tracing Confirmed by Anitra LauthPLUNKETT  MD, Alphonzo LemmingsWHITNEY ([id number]) on  09/16/2014 5:52:25 PM      MDM   Final diagnoses:  Dental abscess  Chronic back pain   Otherwise healthy 38 year old male presents with multiple complaints. He has upper back  pain, tenderness to palpation of the muscles between his scapula, and no radiation of the pain. He has no red flag symptoms concerning for epidural abscess or cauda equina. He has had no fever and is not in IV drug user. He has no midline tenderness or recent trauma to his back. He does not describe chest pain radiating towards his back, and has a normal chest x-ray, doubt pneumonia, pneumothorax or dissection. His abdominal pain is chronic, and he says he feels bloated. He does have a history of dyspepsia and takes medications for excessive gas. Likely this because of the abdominal pain. Reviewed laboratory studies, no significant abnormalities.  Also has dental abscess, we will prescribe penicillin, and have him follow-up with a dentist as soon as possible's an outpatient. We'll provide him with a list of resources for dental care in the area.   Erskine Emeryhris Nishawn Rotan, MD 09/16/14 [id number]  Gwyneth SproutWhitney Plunkett, MD 09/16/14 [phone]

## 2014-09-16 NOTE — Discharge Instructions (Signed)
Chest Pain (Nonspecific) It is often hard to give a diagnosis for the cause of chest pain. There is always a chance that your pain could be related to something serious, such as a heart attack or a blood clot in the lungs. You need to follow up with your doctor. HOME CARE  If antibiotic medicine was given, take it as directed by your doctor. Finish the medicine even if you start to feel better.  For the next few days, avoid activities that bring on chest pain. Continue physical activities as told by your doctor.  Do not use any tobacco products. This includes cigarettes, chewing tobacco, and e-cigarettes.  Avoid drinking alcohol.  Only take medicine as told by your doctor.  Follow your doctor's suggestions for more testing if your chest pain does not go away.  Keep all doctor visits you made. GET HELP IF:  Your chest pain does not go away, even after treatment.  You have a rash with blisters on your chest.  You have a fever. GET HELP RIGHT AWAY IF:   You have more pain or pain that spreads to your arm, neck, jaw, back, or belly (abdomen).  You have shortness of breath.  You cough more than usual or cough up blood.  You have very bad back or belly pain.  You feel sick to your stomach (nauseous) or throw up (vomit).  You have very bad weakness.  You pass out (faint).  You have chills. This is an emergency. Do not wait to see if the problems will go away. Call your local emergency services (911 in [place] Do not drive yourself to the hospital. MAKE SURE YOU:   Understand these instructions.  Will watch your condition.  Will get help right away if you are not doing well or get worse. Document Released: 03/12/2008 Document Revised: 09/29/2013 Document Reviewed: 03/12/2008 [employer] Patient Information 2015 [place], [place]. This information is not intended to replace advice given to you by your health care provider. Make sure you discuss any questions you have with your  health care provider.   Dental Care: Organization         Address  Phone  Notes  [facility] Department of [facility] [address] [place], [place] [phone] Accepts children up to age 1 who are enrolled in [place] or [place] Health Choice; pregnant women with a Medicaid card; and children who have applied for Medicaid or [place] Health Choice, but were declined, whose parents can pay a reduced fee at time of service.  [facility] Department of [facility]  [address], [place] [phone] Accepts children up to age 33 who are enrolled in [place] or [place] Health Choice; pregnant women with a Medicaid card; and children who have applied for Medicaid or [place] Health Choice, but were declined, whose parents can pay a reduced fee at time of service.  [facility]  [address] [place], [place] [phone] Patients are seen by appointment only. Walk-ins are not accepted. [facility] will see patients 31 years of age and older. Monday - Tuesday (8am-5pm) Most Wednesdays (8:30-5pm) $30 per visit, cash only  [facility]  [address], [facility] [phone] Patients are seen by appointment only. Walk-ins are not accepted. [facility] will see patients 55 years of age and older. One Wednesday Evening (Monthly: Volunteer Based).  $30 per visit, cash only  [employer] [facility]  Clinics  [phone]) [phone] for adults; Children under age 754, call Graduate Pediatric Dentistry at [phone]. Children aged 94-14, please call [phone] to request a pediatric application.  Dental services are provided in all areas of dental care including fillings, crowns and bridges, complete and partial dentures, implants, gum treatment, root canals, and extractions. Preventive care is also provided. Treatment is provided to both adults and children. Patients are selected via a lottery and there is  often a waiting list.   [facility] [address], [place]  [phone] [email or web address]   [facility] [address], [place], [place] [phone], Ext. [phone] Second and Fourth Thursday of each month, opens at 6:30 AM; Clinic ends at 9 AM.  Patients are seen on a first-come first-served basis, and a limited number are seen during each clinic.   [facility]  [address], [place], [place] [phone]   Eligibility Requirements You must have lived in [place], [place], or [place] counties for at least the last three months.   You cannot be eligible for state or federal sponsored [place]healthcare insurance, including [employer]IG[employer]eterans Administration, [place], or [employer]icare.   You generally cannot be eligible for healthcare insurance through your employer.    How to apply: Eligibility screenings are held every Tuesday and Wednesday afternoon from 1:00 pm until 4:00 pm. You do not need an appointment for the interview!  [facility] [address], [place], [place] [phone]   [facility]  [phone]   [facility]  [phone]   [facility]  [phone]    Behavioral Health Resources in the Community: Intensive Outpatient Programs Organization         Address  Phone  Notes  [facility] [address], [place], [place] [phone]   [facility] [address], [place], [place] [phone]   [facility] [address], [place], [place]  [phone]   [facility] [address],  [place], [place] [phone] or [phone]   Substance Abuse Resources Organization         Address  Phone  Notes  [facility]  [phone]   [facility]  [phone]   [facility]  [phone]   [facility]  [phone]   Residential & Outpatient Substance  Abuse Program  [phone]   Psychological Services Organization         Address  Phone  Notes  [facility]  [phone]- [phone]   [facility]  [phone]- [phone]   [facility] [address], [place] [phone] or [phone]    Mobile Crisis Teams Organization         Address  Phone  Notes  [facility], Mobile Crisis Care Unit  [phone]   [facility]  [address] [place], [place] [phone]   Doristine LocksSharon DeEsch [address] [place] [phone]    Self-Help/Support Groups Organization         Address  Phone             Notes  [facility] [place] - variety of support groups  [phone]- [phone] Call for more information  Narcotics Anonymous (NA), [facility] [address], [place]  2 meetings at this location   [facility] Organization         Address  Phone  Notes  [facility] [address],    [place]  [phone]   [facility]  [address], [place] [phone], [place], [place] [phone]   [facility] [address], [place] [phone] Admissions: 8am-3pm M-F  [facility] [address]    [place], [place] [phone]   [facility] [address], [place], [place] [phone]   [facility] [address]  [place], [place] [phone]   [facility] - Intensive Outpatient [address], [place], [place] [phone]   [facility] ([facility] [address]  [place], [place] [phone] or [phone]   [facility] ([facility]) [address] [place], [place] [phone] Accepts Medicaid  [facility] [address]  [place] [phone] Substance Abuse/Addiction Treatment   [facility] Resources Organization          Address  Phone  Notes  [facility]  [phone]   Angie FavaJulie Brannon, PhD [address], [address] [place], [place]   [phone] or [phone]) [phone]   [facility]   [address] [place], [place] [phone]   [facility] [address], [place], [place] [phone] Insurance/Medicaid/sponsorship through [facility]  [facility] Families [address]                                    [place], [place] [phone] Therapy/tele-psych/case  [facility] [address]   [place], [place] [phone]    Dr. Lolly MustacheArfeen  [phone]   [facility]  [employer] [facility] 1) [address], [place] 2) [address], [place] 3)  [address], [place] [phone]  [phone]   [facility] Child Abuse Hotline [phone] or [phone] (After Hours)

## 2014-09-16 NOTE — ED Notes (Signed)
Pt in NAD. Talking, laughing. States intermittent,generalized chest pain that began when he woke up this am as well as some gas, back pain that began when he was in route here. Reports also that he has some pain in bilateral ears and that he thinks it is coming from his teeth. States he came here because he could not see his pcp today.

## 2014-09-17 MED ORDER — NEOMYCIN-POLYMYXIN-HC 3.5-10000-1 OT SOLN: 3.0000 [drp] | Freq: Three times a day (TID) | OTIC | Status: DC

## 2014-11-08 ENCOUNTER — Emergency Department (HOSPITAL_COMMUNITY)
Admission: EM | Admit: 2014-11-08 | Discharge: 2014-11-08 | Disposition: A | Discharge status: Home / Self Care | Payer: No Typology Code available for payment source | Attending: Emergency Medicine | Admitting: Emergency Medicine

## 2014-11-08 ENCOUNTER — Encounter (HOSPITAL_COMMUNITY): Payer: Self-pay | Admitting: Neurology

## 2014-11-08 DIAGNOSIS — L309 Dermatitis, unspecified: Secondary | ICD-10-CM

## 2014-11-08 DIAGNOSIS — Z792 Long term (current) use of antibiotics: Secondary | ICD-10-CM | POA: Insufficient documentation

## 2014-11-08 DIAGNOSIS — R143 Flatulence: Secondary | ICD-10-CM

## 2014-11-08 DIAGNOSIS — L259 Unspecified contact dermatitis, unspecified cause: Secondary | ICD-10-CM | POA: Insufficient documentation

## 2014-11-08 DIAGNOSIS — K029 Dental caries, unspecified: Secondary | ICD-10-CM | POA: Insufficient documentation

## 2014-11-08 DIAGNOSIS — Z8659 Personal history of other mental and behavioral disorders: Secondary | ICD-10-CM | POA: Insufficient documentation

## 2014-11-08 DIAGNOSIS — Z79899 Other long term (current) drug therapy: Secondary | ICD-10-CM | POA: Insufficient documentation

## 2014-11-08 DIAGNOSIS — Z7951 Long term (current) use of inhaled steroids: Secondary | ICD-10-CM | POA: Insufficient documentation

## 2014-11-08 DIAGNOSIS — Z87891 Personal history of nicotine dependence: Secondary | ICD-10-CM | POA: Insufficient documentation

## 2014-11-08 MED ORDER — HYDROCORTISONE 1 % EX CREA: TOPICAL_CREAM | CUTANEOUS | Status: DC

## 2014-11-08 NOTE — Discharge Instructions (Signed)
Apply hydrocortisone cream twice daily. Follow-up with the wellness clinic or one of the resources below to establish care with a primary care physician. Follow up with the dentist.  Contact Dermatitis Contact dermatitis is a reaction to certain substances that touch the skin. Contact dermatitis can be either irritant contact dermatitis or allergic contact dermatitis. Irritant contact dermatitis does not require previous exposure to the substance for a reaction to occur.Allergic contact dermatitis only occurs if you have been exposed to the substance before. Upon a repeat exposure, your body reacts to the substance.  CAUSES  Many substances can cause contact dermatitis. Irritant dermatitis is most commonly caused by repeated exposure to mildly irritating substances, such as:  Makeup.  Soaps.  Detergents.  Bleaches.  Acids.  Metal salts, such as nickel. Allergic contact dermatitis is most commonly caused by exposure to:  Poisonous plants.  Chemicals (deodorants, shampoos).  Jewelry.  Latex.  Neomycin in triple antibiotic cream.  Preservatives in products, including clothing. SYMPTOMS  The area of skin that is exposed may develop:  Dryness or flaking.  Redness.  Cracks.  Itching.  Pain or a burning sensation.  Blisters. With allergic contact dermatitis, there may also be swelling in areas such as the eyelids, mouth, or genitals.  DIAGNOSIS  Your caregiver can usually tell what the problem is by doing a physical exam. In cases where the cause is uncertain and an allergic contact dermatitis is suspected, a patch skin test may be performed to help determine the cause of your dermatitis. TREATMENT Treatment includes protecting the skin from further contact with the irritating substance by avoiding that substance if possible. Barrier creams, powders, and gloves may be helpful. Your caregiver may also recommend:  Steroid creams or ointments applied 2 times daily. For best  results, soak the rash area in cool water for 20 minutes. Then apply the medicine. Cover the area with a plastic wrap. You can store the steroid cream in the refrigerator for a "chilly" effect on your rash. That may decrease itching. Oral steroid medicines may be needed in more severe cases.  Antibiotics or antibacterial ointments if a skin infection is present.  Antihistamine lotion or an antihistamine taken by mouth to ease itching.  Lubricants to keep moisture in your skin.  Burow's solution to reduce redness and soreness or to dry a weeping rash. Mix one packet or tablet of solution in 2 cups cool water. Dip a clean washcloth in the mixture, wring it out a bit, and put it on the affected area. Leave the cloth in place for 30 minutes. Do this as often as possible throughout the day.  Taking several cornstarch or baking soda baths daily if the area is too large to cover with a washcloth. Harsh chemicals, such as alkalis or acids, can cause skin damage that is like a burn. You should flush your skin for 15 to 20 minutes with cold water after such an exposure. You should also seek immediate medical care after exposure. Bandages (dressings), antibiotics, and pain medicine may be needed for severely irritated skin.  HOME CARE INSTRUCTIONS  Avoid the substance that caused your reaction.  Keep the area of skin that is affected away from hot water, soap, sunlight, chemicals, acidic substances, or anything else that would irritate your skin.  Do not scratch the rash. Scratching may cause the rash to become infected.  You may take cool baths to help stop the itching.  Only take over-the-counter or prescription medicines as directed by your caregiver.  See your caregiver for follow-up care as directed to make sure your skin is healing properly. SEEK MEDICAL CARE IF:   Your condition is not better after 3 days of treatment.  You seem to be getting worse.  You see signs of infection such as  swelling, tenderness, redness, soreness, or warmth in the affected area.  You have any problems related to your medicines. Document Released: 09/21/2000 Document Revised: 12/17/2011 Document Reviewed: 02/27/2011 [facility] Patient Information 2015 [employer], [place]. This information is not intended to replace advice given to you by your health care provider. Make sure you discuss any questions you have with your health care provider.  Dental Care and Dentist Visits Dental care supports good overall health. Regular dental visits can also help you avoid dental pain, bleeding, infection, and other more serious health problems in the future. It is important to keep the mouth healthy because diseases in the teeth, gums, and other oral tissues can spread to other areas of the body. Some problems, such as diabetes, heart disease, and pre-term labor have been associated with poor oral health.  See your dentist every 6 months. If you experience emergency problems such as a toothache or broken tooth, go to the dentist right away. If you see your dentist regularly, you may catch problems early. It is easier to be treated for problems in the early stages.  WHAT TO EXPECT AT A DENTIST VISIT  Your dentist will look for many common oral health problems and recommend proper treatment. At your regular dental visit, you can expect:  Gentle cleaning of the teeth and gums. This includes scraping and polishing. This helps to remove the sticky substance around the teeth and gums (plaque). Plaque forms in the mouth shortly after eating. Over time, plaque hardens on the teeth as tartar. If tartar is not removed regularly, it can cause problems. Cleaning also helps remove stains.  Periodic X-rays. These pictures of the teeth and supporting bone will help your dentist assess the health of your teeth.  Periodic fluoride treatments. Fluoride is a natural mineral shown to help strengthen teeth. Fluoride treatmentinvolves applying a  fluoride gel or varnish to the teeth. It is most commonly done in children.  Examination of the mouth, tongue, jaws, teeth, and gums to look for any oral health problems, such as:  Cavities (dental caries). This is decay on the tooth caused by plaque, sugar, and acid in the mouth. It is best to catch a cavity when it is small.  Inflammation of the gums caused by plaque buildup (gingivitis).  Problems with the mouth or malformed or misaligned teeth.  Oral cancer or other diseases of the soft tissues or jaws. KEEP YOUR TEETH AND GUMS HEALTHY For healthy teeth and gums, follow these general guidelines as well as your dentist's specific advice:  Have your teeth professionally cleaned at the dentist every 6 months.  Brush twice daily with a fluoride toothpaste.  Floss your teeth daily.  Ask your dentist if you need fluoride supplements, treatments, or fluoride toothpaste.  Eat a healthy diet. Reduce foods and drinks with added sugar.  Avoid smoking. TREATMENT FOR ORAL HEALTH PROBLEMS If you have oral health problems, treatment varies depending on the conditions present in your teeth and gums.  Your caregiver will most likely recommend good oral hygiene at each visit.  For cavities, gingivitis, or other oral health disease, your caregiver will perform a procedure to treat the problem. This is typically done at a separate appointment. Sometimes your caregiver  will refer you to another dental specialist for specific tooth problems or for surgery. SEEK IMMEDIATE DENTAL CARE IF:  You have pain, bleeding, or soreness in the gum, tooth, jaw, or mouth area.  A permanent tooth becomes loose or separated from the gum socket.  You experience a blow or injury to the mouth or jaw area. Document Released: 06/06/2011 Document Revised: 12/17/2011 Document Reviewed: 06/06/2011 [facility] Patient Information 2015 [place], [place]. This information is not intended to replace advice given to you by your  health care provider. Make sure you discuss any questions you have with your health care provider.

## 2014-11-08 NOTE — ED Notes (Signed)
Pt reports gas, constipation. Also right front tooth dental pain and has a rash to his buttocks that itches.

## 2014-11-08 NOTE — ED Provider Notes (Signed)
CSN: [id number]     Arrival date & time 11/08/14  1217 History   First MD Initiated Contact with Patient 11/08/14 1254     Chief Complaint  Patient presents with  . GI Problem  . Dental Pain  . Rash     (Consider location/radiation/quality/duration/timing/severity/associated sxs/prior Treatment) HPI Comments: 39 year old male presenting with multiple complaints. First, patient reports he's been experiencing gas and constipation intermittently "for his whole life". Admits to increased flatus and a bloating sensation in his abdomen, worse after eating pizza and Mindi Slicker. He had a normal bowel movement one day ago. Denies nausea or vomiting. He tries taking over-the-counter antacids with only mild relief. Second, patient is complaining of a rash to his buttock area that has been present for his "entire life". The rash is itchy. No aggravating or alleviating factors. Third, patient is complaining of right upper front dental pain "for a while". He reports having a chipped tooth and a cavity. He does not have a dentist. Denies any fevers or facial swelling. Pain radiates to his right ear. Pain worse with chewing, hot and cold.   Past Medical History  Diagnosis Date  . Attention deficit disorder (ADD)   . Seasonal allergies    Past Surgical History  Procedure Laterality Date  . Rhinoplasty     Family History  Problem Relation Age of Onset  . Diabetes Mother   . Cancer Father   . Diabetes Brother    History  Substance Use Topics  . Smoking status: Former Games developer  . Smokeless tobacco: Not on file  . Alcohol Use: No    Review of Systems  10 Systems reviewed and are negative for acute change except as noted in the HPI.  Allergies  Other; Oysters; Soybean-containing drug products; and Strawberry  Home Medications   Prior to Admission medications   Medication Sig Start Date End Date Taking? Authorizing Provider  carbamide peroxide (DEBROX) 6.5 % otic solution Place 5 drops into  both ears 2 (two) times daily. Patient not taking: Reported on 09/16/2014 08/24/14   Doris Cheadle, MD  cetirizine (ZYRTEC) 10 MG tablet Take 1 tablet (10 mg total) by mouth daily. 08/24/14   Doris Cheadle, MD  cyclobenzaprine (FLEXERIL) 10 MG tablet Take 1 tablet (10 mg total) by mouth 2 (two) times daily as needed for muscle spasms. Patient not taking: Reported on 09/16/2014 05/19/14   Tatyana A Kirichenko, PA-C  fluticasone (FLONASE) 50 MCG/ACT nasal spray Place 2 sprays into both nostrils daily. 08/24/14   Doris Cheadle, MD  hydrocortisone cream 1 % Apply to affected area 2 times daily 11/08/14   Nada Boozer Auriella Wieand, PA-C  ibuprofen (ADVIL,MOTRIN) 600 MG tablet Take 1 tablet (600 mg total) by mouth every 6 (six) hours as needed. Patient not taking: Reported on 09/16/2014 05/19/14   Tatyana A Kirichenko, PA-C  neomycin-polymyxin-hydrocortisone (CORTISPORIN) otic solution Place 3 drops into the right ear 3 (three) times daily. 09/17/14   Trixie Dredge, PA-C  omeprazole (PRILOSEC) 20 MG capsule Take 1 capsule (20 mg total) by mouth daily. 08/24/14   Deepak Advani, MD   BP 140/103 mmHg  Pulse 64  Temp(Src) 98.7 F (37.1 C) (Oral)  Resp 14  SpO2 100% Physical Exam  Constitutional: He is oriented to person, place, and time. He appears well-developed and well-nourished. No distress.  HENT:  Head: Normocephalic and atraumatic.  Mouth/Throat: Uvula is midline.    Poor dentition. Multiple dental caries and tooth decay.  Eyes: Conjunctivae and EOM are normal.  Neck: Normal range of motion. Neck supple.  Cardiovascular: Normal rate, regular rhythm and normal heart sounds.   Pulmonary/Chest: Effort normal and breath sounds normal.  Abdominal: Soft. Bowel sounds are normal. He exhibits no distension and no mass. There is no tenderness. There is no rigidity, no rebound, no guarding, no tenderness at McBurney's point and negative Murphy's sign.  Musculoskeletal: Normal range of motion. He exhibits no edema.    Neurological: He is alert and oriented to person, place, and time.  Skin: Skin is warm and dry.  Psychiatric: He has a normal mood and affect. His behavior is normal.  Nursing note and vitals reviewed.   ED Course  Procedures (including critical care time) Labs Review Labs Reviewed - No data to display  Imaging Review No results found.   EKG Interpretation None      MDM   Final diagnoses:  Dermatitis  Dental caries  Excessive flatus   Patient in no apparent distress. The complaints he has today have been present for his "entire life". He had a normal bowel movement yesterday. Abdomen is soft and nontender. No acute changes of his symptoms. Regarding dental pain, there is no signs of infection. He reports he has a dentist that he was suggested to use and will be able to give them a call tomorrow to schedule an appointment. The rash is also not an acute change, however I will advised to use hydrocortisone cream and Benadryl for itching. Discussed importance of establishing care with a primary care physician. The pressure slightly elevated, discussed dietary and lifestyle changes. Stable for discharge. Return precautions given. Patient states understanding of treatment care plan and is agreeable.  Kathrynn SpeedRobyn M Keoshia Steinmetz, PA-C 11/08/14 1408  Kathrynn Speedobyn M Annie Roseboom, PA-C 11/08/14 1524  Flint MelterElliott L Wentz, MD 11/08/14 [phone]

## 2014-11-17 ENCOUNTER — Ambulatory Visit: Payer: Self-pay | Attending: Internal Medicine | Admitting: Internal Medicine

## 2014-11-17 ENCOUNTER — Encounter: Payer: Self-pay | Admitting: Internal Medicine

## 2014-11-17 VITALS — BP 146/69 | HR 67 | Temp 98.0°F | Ht 67.0 in | Wt 207.0 lb

## 2014-11-17 DIAGNOSIS — Z79899 Other long term (current) drug therapy: Secondary | ICD-10-CM | POA: Insufficient documentation

## 2014-11-17 DIAGNOSIS — L299 Pruritus, unspecified: Secondary | ICD-10-CM | POA: Insufficient documentation

## 2014-11-17 DIAGNOSIS — R030 Elevated blood-pressure reading, without diagnosis of hypertension: Secondary | ICD-10-CM | POA: Insufficient documentation

## 2014-11-17 DIAGNOSIS — Z7951 Long term (current) use of inhaled steroids: Secondary | ICD-10-CM | POA: Insufficient documentation

## 2014-11-17 DIAGNOSIS — R0981 Nasal congestion: Secondary | ICD-10-CM | POA: Insufficient documentation

## 2014-11-17 DIAGNOSIS — R1013 Epigastric pain: Secondary | ICD-10-CM | POA: Insufficient documentation

## 2014-11-17 DIAGNOSIS — Z87891 Personal history of nicotine dependence: Secondary | ICD-10-CM | POA: Insufficient documentation

## 2014-11-17 DIAGNOSIS — IMO0001 Reserved for inherently not codable concepts without codable children: Secondary | ICD-10-CM

## 2014-11-17 DIAGNOSIS — R21 Rash and other nonspecific skin eruption: Secondary | ICD-10-CM | POA: Insufficient documentation

## 2014-11-17 MED ORDER — HYDROXYZINE HCL 10 MG PO TABS: 10.0000 mg | ORAL_TABLET | Freq: Three times a day (TID) | ORAL | Status: DC | PRN

## 2014-11-17 MED ORDER — OMEPRAZOLE 20 MG PO CPDR: 20.0000 mg | DELAYED_RELEASE_CAPSULE | Freq: Every day | ORAL | Status: DC

## 2014-11-17 MED ORDER — FLUTICASONE PROPIONATE 50 MCG/ACT NA SUSP: 2.0000 | Freq: Every day | NASAL | Status: DC

## 2014-11-17 MED ORDER — LORATADINE 10 MG PO TABS: 10.0000 mg | ORAL_TABLET | Freq: Every day | ORAL | Status: DC

## 2014-11-17 MED ORDER — HYDROCORTISONE 2.5 % EX CREA: TOPICAL_CREAM | Freq: Two times a day (BID) | CUTANEOUS | Status: DC

## 2014-11-17 NOTE — Patient Instructions (Signed)
DASH Eating Plan °DASH stands for "Dietary Approaches to Stop Hypertension." The DASH eating plan is a healthy eating plan that has been shown to reduce high blood pressure (hypertension). Additional health benefits may include reducing the risk of type 2 diabetes mellitus, heart disease, and stroke. The DASH eating plan may also help with weight loss. °WHAT DO I NEED TO KNOW ABOUT THE DASH EATING PLAN? °For the DASH eating plan, you will follow these general guidelines: °· Choose foods with a percent daily value for sodium of less than 5% (as listed on the food label). °· Use salt-free seasonings or herbs instead of table salt or sea salt. °· Check with your health care provider or pharmacist before using salt substitutes. °· Eat lower-sodium products, often labeled as "lower sodium" or "no salt added." °· Eat fresh foods. °· Eat more vegetables, fruits, and low-fat dairy products. °· Choose whole grains. Look for the word "whole" as the first word in the ingredient list. °· Choose fish and skinless chicken or turkey more often than red meat. Limit fish, poultry, and meat to 6 oz (170 g) each day. °· Limit sweets, desserts, sugars, and sugary drinks. °· Choose heart-healthy fats. °· Limit cheese to 1 oz (28 g) per day. °· Eat more home-cooked food and less restaurant, buffet, and fast food. °· Limit fried foods. °· Cook foods using methods other than frying. °· Limit canned vegetables. If you do use them, rinse them well to decrease the sodium. °· When eating at a restaurant, ask that your food be prepared with less salt, or no salt if possible. °WHAT FOODS CAN I EAT? °Seek help from a dietitian for individual calorie needs. °Grains °Whole grain or whole wheat bread. Brown rice. Whole grain or whole wheat pasta. Quinoa, bulgur, and whole grain cereals. Low-sodium cereals. Corn or whole wheat flour tortillas. Whole grain cornbread. Whole grain crackers. Low-sodium crackers. °Vegetables °Fresh or frozen vegetables  (raw, steamed, roasted, or grilled). Low-sodium or reduced-sodium tomato and vegetable juices. Low-sodium or reduced-sodium tomato sauce and paste. Low-sodium or reduced-sodium canned vegetables.  °Fruits °All fresh, canned (in natural juice), or frozen fruits. °Meat and Other Protein Products °Ground beef (85% or leaner), grass-fed beef, or beef trimmed of fat. Skinless chicken or turkey. Ground chicken or turkey. Pork trimmed of fat. All fish and seafood. Eggs. Dried beans, peas, or lentils. Unsalted nuts and seeds. Unsalted canned beans. °Dairy °Low-fat dairy products, such as skim or 1% milk, 2% or reduced-fat cheeses, low-fat ricotta or cottage cheese, or plain low-fat yogurt. Low-sodium or reduced-sodium cheeses. °Fats and Oils °Tub margarines without trans fats. Light or reduced-fat mayonnaise and salad dressings (reduced sodium). Avocado. Safflower, olive, or canola oils. Natural peanut or almond butter. °Other °Unsalted popcorn and pretzels. °The items listed above may not be a complete list of recommended foods or beverages. Contact your dietitian for more options. °WHAT FOODS ARE NOT RECOMMENDED? °Grains °White bread. White pasta. White rice. Refined cornbread. Bagels and croissants. Crackers that contain trans fat. °Vegetables °Creamed or fried vegetables. Vegetables in a cheese sauce. Regular canned vegetables. Regular canned tomato sauce and paste. Regular tomato and vegetable juices. °Fruits °Dried fruits. Canned fruit in light or heavy syrup. Fruit juice. °Meat and Other Protein Products °Fatty cuts of meat. Ribs, chicken wings, bacon, sausage, bologna, salami, chitterlings, fatback, hot dogs, bratwurst, and packaged luncheon meats. Salted nuts and seeds. Canned beans with salt. °Dairy °Whole or 2% milk, cream, half-and-half, and cream cheese. Whole-fat or sweetened yogurt. Full-fat   cheeses or blue cheese. Nondairy creamers and whipped toppings. Processed cheese, cheese spreads, or cheese  curds. °Condiments °Onion and garlic salt, seasoned salt, table salt, and sea salt. Canned and packaged gravies. Worcestershire sauce. Tartar sauce. Barbecue sauce. Teriyaki sauce. Soy sauce, including reduced sodium. Steak sauce. Fish sauce. Oyster sauce. Cocktail sauce. Horseradish. Ketchup and mustard. Meat flavorings and tenderizers. Bouillon cubes. Hot sauce. Tabasco sauce. Marinades. Taco seasonings. Relishes. °Fats and Oils °Butter, stick margarine, lard, shortening, ghee, and bacon fat. Coconut, palm kernel, or palm oils. Regular salad dressings. °Other °Pickles and olives. Salted popcorn and pretzels. °The items listed above may not be a complete list of foods and beverages to avoid. Contact your dietitian for more information. °WHERE CAN I FIND MORE INFORMATION? °National Heart, Lung, and Blood Institute: www.nhlbi.nih.gov/health/health-topics/topics/dash/ °Document Released: 09/13/2011 Document Revised: 02/08/2014 Document Reviewed: 07/29/2013 °[employer]® Patient Information ©2015 [employer]. This information is not intended to replace advice given to you by your health care provider. Make sure you discuss any questions you have with your health care provider. ° °

## 2014-11-17 NOTE — Progress Notes (Signed)
Pt is here because he has been having constipation and bloating,and gas. He states certain food make it worse.  He also c/o having a flare of eczema. [place] hydroxyzine 25 mg and loratadine 10mg  daily and this helped.  He is asking for rx for these meds.

## 2014-11-17 NOTE — Progress Notes (Signed)
MRN: [id number] Name: Michael Daugherty  Sex: male Age: 39 y.o. DOB: 09-Nov-1975  Allergies: Other; Oysters; Soybean-containing drug products; and Strawberry  Chief Complaint  Patient presents with  . Follow-up    HPI: Patient is 39 y.o. male who has history of hyperlipidemia comes today for follow up recently went to ER with symptoms of itching and dyspepsia, was prescribed hydrocortisone 1% cream as per patient it does not help much denies any change in soap detergent or any new foods, he has chronic allergies and is requesting prescription for claritin and atarax which he used in the past. Today his BP is elevated he denies any HA dizziness CP or SOB. Does co,plain and nasal congestion and is requesting refill on nasal spray.    Past Medical History  Diagnosis Date  . Attention deficit disorder (ADD)   . Seasonal allergies     Past Surgical History  Procedure Laterality Date  . Rhinoplasty        Medication List       This list is accurate as of: 11/17/14 10:10 AM.  Always use your most recent med list.               carbamide peroxide 6.5 % otic solution  Commonly known as:  DEBROX  Place 5 drops into both ears 2 (two) times daily.     cetirizine 10 MG tablet  Commonly known as:  ZYRTEC  Take 1 tablet (10 mg total) by mouth daily.     cyclobenzaprine 10 MG tablet  Commonly known as:  FLEXERIL  Take 1 tablet (10 mg total) by mouth 2 (two) times daily as needed for muscle spasms.     fluticasone 50 MCG/ACT nasal spray  Commonly known as:  FLONASE  Place 2 sprays into both nostrils daily.     hydrocortisone cream 1 %  Apply to affected area 2 times daily     hydrocortisone 2.5 % cream  Apply topically 2 (two) times daily.     hydrOXYzine 10 MG tablet  Commonly known as:  ATARAX/VISTARIL  Take 1 tablet (10 mg total) by mouth 3 (three) times daily as needed for itching.     ibuprofen 600 MG tablet  Commonly known as:  ADVIL,MOTRIN  Take 1 tablet (600  mg total) by mouth every 6 (six) hours as needed.     loratadine 10 MG tablet  Commonly known as:  CLARITIN  Take 1 tablet (10 mg total) by mouth daily.     neomycin-polymyxin-hydrocortisone otic solution  Commonly known as:  CORTISPORIN  Place 3 drops into the right ear 3 (three) times daily.     omeprazole 20 MG capsule  Commonly known as:  PRILOSEC  Take 1 capsule (20 mg total) by mouth daily.        Meds ordered this encounter  Medications  . hydrOXYzine (ATARAX/VISTARIL) 10 MG tablet    Sig: Take 1 tablet (10 mg total) by mouth 3 (three) times daily as needed for itching.    Dispense:  30 tablet    Refill:  3  . loratadine (CLARITIN) 10 MG tablet    Sig: Take 1 tablet (10 mg total) by mouth daily.    Dispense:  30 tablet    Refill:  3  . hydrocortisone 2.5 % cream    Sig: Apply topically 2 (two) times daily.    Dispense:  30 g    Refill:  2  . fluticasone (FLONASE) 50 MCG/ACT nasal spray  Sig: Place 2 sprays into both nostrils daily.    Dispense:  16 g    Refill:  6  . omeprazole (PRILOSEC) 20 MG capsule    Sig: Take 1 capsule (20 mg total) by mouth daily.    Dispense:  30 capsule    Refill:  3    Immunization History  Administered Date(s) Administered  . Influenza,inj,Quad PF,36+ Mos 08/24/2014    Family History  Problem Relation Age of Onset  . Diabetes Mother   . Cancer Father   . Diabetes Brother     History  Substance Use Topics  . Smoking status: Former Games developer  . Smokeless tobacco: Not on file  . Alcohol Use: No    Review of Systems   As noted in HPI  Filed Vitals:   11/17/14 0953  BP: 146/69  Pulse: 67  Temp: 98 F (36.7 C)    Physical Exam  Physical Exam  Constitutional: No distress.  Eyes: EOM are normal. Pupils are equal, round, and reactive to light.  Cardiovascular: Normal rate and regular rhythm.   Pulmonary/Chest: Breath sounds normal. No respiratory distress. He has no wheezes. He has no rales.    CBC      Component Value Date/Time   WBC 5.0 09/16/2014 1500   RBC 5.86* 09/16/2014 1500   HGB 13.7 09/16/2014 1500   HCT 42.9 09/16/2014 1500   PLT 154 09/16/2014 1500   MCV 73.2* 09/16/2014 1500    CMP     Component Value Date/Time   NA 142 09/16/2014 1441   K 4.5 09/16/2014 1441   CL 105 09/16/2014 1441   CO2 26 09/16/2014 1441   GLUCOSE 96 09/16/2014 1441   BUN 9 09/16/2014 1441   CREATININE 0.98 09/16/2014 1441   CALCIUM 9.3 09/16/2014 1441   GFRNONAA >90 09/16/2014 1441   GFRAA >90 09/16/2014 1441    Lab Results  Component Value Date/Time   CHOL 172 12/08/2013 12:14 PM    No components found for: HGA1C  No results found for: AST  Assessment and Plan  Nasal congestion - Plan: fluticasone (FLONASE) 50 MCG/ACT nasal spray  Rash and nonspecific skin eruption - Plan: hydrocortisone 2.5 % cream  Dyspepsia - Plan: omeprazole (PRILOSEC) 20 MG capsule  Elevated BP  Itching - Plan: hydrOXYzine (ATARAX/VISTARIL) 10 MG tablet, loratadine (CLARITIN) 10 MG tablet   Health Maintenance  -Vaccinations:  uptodate with the flu shot   Return in about 3 months (around 02/15/2015) for hyperipidemia.  Doris Cheadle, MD

## 2015-02-01 ENCOUNTER — Encounter: Payer: Self-pay | Admitting: Internal Medicine

## 2015-02-01 ENCOUNTER — Ambulatory Visit: Payer: Self-pay | Attending: Internal Medicine | Admitting: Internal Medicine

## 2015-02-01 VITALS — BP 135/85 | HR 61 | Temp 98.0°F | Resp 16 | Wt 195.8 lb

## 2015-02-01 DIAGNOSIS — K0889 Other specified disorders of teeth and supporting structures: Secondary | ICD-10-CM

## 2015-02-01 DIAGNOSIS — K088 Other specified disorders of teeth and supporting structures: Secondary | ICD-10-CM

## 2015-02-01 DIAGNOSIS — K029 Dental caries, unspecified: Secondary | ICD-10-CM

## 2015-02-01 MED ORDER — IBUPROFEN 600 MG PO TABS: 600.0000 mg | ORAL_TABLET | Freq: Four times a day (QID) | ORAL | Status: DC | PRN

## 2015-02-01 MED ORDER — AMOXICILLIN-POT CLAVULANATE 875-125 MG PO TABS: 1.0000 | ORAL_TABLET | Freq: Two times a day (BID) | ORAL | Status: DC

## 2015-02-01 NOTE — Progress Notes (Signed)
Patient has been having dental to the right side of his mouth for the past week Patient is requesting something be prescribed for the his pain Patient will also need referral to the dentist

## 2015-02-01 NOTE — Progress Notes (Signed)
MRN: [id number] Name: Michael Daugherty  Sex: male Age: 39 y.o. DOB: 23-Sep-1976  Allergies: Other; Oysters; Soybean-containing drug products; and Strawberry  Chief Complaint  Patient presents with  . Dental Pain    HPI: Patient is 39 y.o. male who comes today complaining of right upper dental pain on and off for 2-3 weeks, denies any fever chills as per patient he saw a dentist last month and had a tooth pulled out, he has several dental cavities, patient is requesting some pain medication to help him with the symptoms, also requesting referral to see a dentist, denies any fever sore throat  headache dizziness chest and shortness of breath  Past Medical History  Diagnosis Date  . Attention deficit disorder (ADD)   . Seasonal allergies     Past Surgical History  Procedure Laterality Date  . Rhinoplasty        Medication List       This list is accurate as of: 02/01/15  9:25 AM.  Always use your most recent med list.               amoxicillin-clavulanate 875-125 MG per tablet  Commonly known as:  AUGMENTIN  Take 1 tablet by mouth 2 (two) times daily.     carbamide peroxide 6.5 % otic solution  Commonly known as:  DEBROX  Place 5 drops into both ears 2 (two) times daily.     cetirizine 10 MG tablet  Commonly known as:  ZYRTEC  Take 1 tablet (10 mg total) by mouth daily.     cyclobenzaprine 10 MG tablet  Commonly known as:  FLEXERIL  Take 1 tablet (10 mg total) by mouth 2 (two) times daily as needed for muscle spasms.     fluticasone 50 MCG/ACT nasal spray  Commonly known as:  FLONASE  Place 2 sprays into both nostrils daily.     hydrocortisone cream 1 %  Apply to affected area 2 times daily     hydrocortisone 2.5 % cream  Apply topically 2 (two) times daily.     hydrOXYzine 10 MG tablet  Commonly known as:  ATARAX/VISTARIL  Take 1 tablet (10 mg total) by mouth 3 (three) times daily as needed for itching.     ibuprofen 600 MG tablet  Commonly known as:   ADVIL,MOTRIN  Take 1 tablet (600 mg total) by mouth every 6 (six) hours as needed.     loratadine 10 MG tablet  Commonly known as:  CLARITIN  Take 1 tablet (10 mg total) by mouth daily.     neomycin-polymyxin-hydrocortisone otic solution  Commonly known as:  CORTISPORIN  Place 3 drops into the right ear 3 (three) times daily.     omeprazole 20 MG capsule  Commonly known as:  PRILOSEC  Take 1 capsule (20 mg total) by mouth daily.        Meds ordered this encounter  Medications  . amoxicillin-clavulanate (AUGMENTIN) 875-125 MG per tablet    Sig: Take 1 tablet by mouth 2 (two) times daily.    Dispense:  20 tablet    Refill:  0  . ibuprofen (ADVIL,MOTRIN) 600 MG tablet    Sig: Take 1 tablet (600 mg total) by mouth every 6 (six) hours as needed.    Dispense:  30 tablet    Refill:  0    Immunization History  Administered Date(s) Administered  . Influenza,inj,Quad PF,36+ Mos 08/24/2014    Family History  Problem Relation Age of Onset  . Diabetes  Mother   . Cancer Father   . Diabetes Brother     History  Substance Use Topics  . Smoking status: Former Games developer  . Smokeless tobacco: Not on file  . Alcohol Use: No    Review of Systems   As noted in HPI  Filed Vitals:   02/01/15 0908  BP: 135/85  Pulse: 61  Temp: 98 F (36.7 C)  Resp: 16    Physical Exam  Physical Exam  Constitutional: No distress.  HENT:  Dental cavities  Eyes: Pupils are equal, round, and reactive to light.  Cardiovascular: Normal rate and regular rhythm.   Pulmonary/Chest: Breath sounds normal. No respiratory distress. He has no wheezes. He has no rales.    CBC    Component Value Date/Time   WBC 5.0 09/16/2014 1500   RBC 5.86* 09/16/2014 1500   HGB 13.7 09/16/2014 1500   HCT 42.9 09/16/2014 1500   PLT 154 09/16/2014 1500   MCV 73.2* 09/16/2014 1500    CMP     Component Value Date/Time   NA 142 09/16/2014 1441   K 4.5 09/16/2014 1441   CL 105 09/16/2014 1441   CO2 26  09/16/2014 1441   GLUCOSE 96 09/16/2014 1441   BUN 9 09/16/2014 1441   CREATININE 0.98 09/16/2014 1441   CALCIUM 9.3 09/16/2014 1441   GFRNONAA >90 09/16/2014 1441   GFRAA >90 09/16/2014 1441    Lab Results  Component Value Date/Time   CHOL 172 12/08/2013 12:14 PM    No results found for: HGBA1C  No results found for: AST  Assessment and Plan  Pain, dental - Plan: Ambulatory referral to Dentistry, amoxicillin-clavulanate (AUGMENTIN) 875-125 MG per tablet, ibuprofen (ADVIL,MOTRIN) 600 MG tablet  Dental cavities - Plan: Ambulatory referral to Dentistry   Return in about 3 months (around 05/03/2015), or if symptoms worsen or fail to improve.   This note has been created with Education officer, environmental. Any transcriptional errors are unintentional.    Doris Cheadle, MD

## 2015-04-07 ENCOUNTER — Ambulatory Visit: Payer: Self-pay | Attending: Internal Medicine | Admitting: Physician Assistant

## 2015-04-07 VITALS — BP 111/75 | HR 83 | Temp 97.9°F | Resp 18 | Ht 67.0 in | Wt 199.0 lb

## 2015-04-07 DIAGNOSIS — R140 Abdominal distension (gaseous): Secondary | ICD-10-CM

## 2015-04-07 DIAGNOSIS — R1013 Epigastric pain: Secondary | ICD-10-CM

## 2015-04-07 MED ORDER — OMEPRAZOLE 20 MG PO CPDR: 20.0000 mg | DELAYED_RELEASE_CAPSULE | Freq: Every day | ORAL | Status: DC

## 2015-04-07 MED ORDER — SIMETHICONE 80 MG PO CHEW: 80.0000 mg | CHEWABLE_TABLET | Freq: Four times a day (QID) | ORAL | Status: DC | PRN

## 2015-04-07 NOTE — Progress Notes (Signed)
Patient here for upset stomach.  Patient ate something Tuesday night and stomach bothered him yesterday.  Needs work note because he was unable to make it to work today.    Patient complains of constipation, gas, diarrhea.  Patient reports this happens a lot, and that he needs to maybe watch what he eats/over eating.  Patient is lactose intolerant.  Patient states he had chocolate milk, chocolate cookie, milkshake, lactade, pizza, yogurt, and thinks that's why his stomach is bothering him. Patient denies fever, and vomiting, but is having some nausea and headaches.

## 2015-04-07 NOTE — Progress Notes (Signed)
Chief Complaint: Gas and abdominal pain  Subjective: 39-year-old male with history of lactose intolerance is presenting with abdominal pain and gas. For the last couple of days he's been rotation marked binging" on foods that he knows aggravates his stomach. He's been eating a lot of beans and rice. His been a lot of cheese pizza. He is also been having chocolate milk and milk shakes. He states that he has had a lot of crampy abdominal pain over the last 24 hours. He's had increased gas and bloating. He denies constipation. He has had a little bit of loose stool. He denies any blood in his stool. He denies any fever or chills. He denies any nausea. He had a call out from work today.  ROS:  GEN: denies fever or chills, denies change in weight Skin: denies lesions or rashes LUNGS: denies SHOB, dyspnea, PND, orthopnea CV: denies CP or palpitations ABD: + abd pain, N or V, +gas and diarrhea    Objective:  Filed Vitals:   04/07/15 1023  BP: 111/75  Pulse: 83  Temp: 97.9 F (36.6 C)  TempSrc: Oral  Resp: 18  Height: 5\' 7"  (1.702 m)  Weight: 199 lb (90.266 kg)  SpO2: 99%    Physical Exam:  General: in no acute distress. HEENT: no pallor, no icterus, moist oral mucosa, no JVD, no lymphadenopathy Heart: Normal  s1 &s2  Regular rate and rhythm, without murmurs, rubs, gallops. Lungs: Clear to auscultation bilaterally. Abdomen: Soft, nontender, nondistended, positive bowel sounds.   Pertinent Lab Results:none   Medications: Prior to Admission medications   Medication Sig Start Date End Date Taking? Authorizing Provider  carbamide peroxide (DEBROX) 6.5 % otic solution Place 5 drops into both ears 2 (two) times daily. 08/24/14  Yes Deepak Advani, MD  fluticasone (FLONASE) 50 MCG/ACT nasal spray Place 2 sprays into both nostrils daily. 11/17/14  Yes Doris Cheadleeepak Advani, MD  hydrocortisone 2.5 % cream Apply topically 2 (two) times daily. 11/17/14  Yes Doris Cheadleeepak Advani, MD  hydrocortisone cream 1  % Apply to affected area 2 times daily 11/08/14  Yes Robyn M Hess, PA-C  hydrOXYzine (ATARAX/VISTARIL) 10 MG tablet Take 1 tablet (10 mg total) by mouth 3 (three) times daily as needed for itching. 11/17/14  Yes Doris Cheadleeepak Advani, MD  ibuprofen (ADVIL,MOTRIN) 600 MG tablet Take 1 tablet (600 mg total) by mouth every 6 (six) hours as needed. 02/01/15  Yes Doris Cheadleeepak Advani, MD  neomycin-polymyxin-hydrocortisone (CORTISPORIN) otic solution Place 3 drops into the right ear 3 (three) times daily. 09/17/14  Yes Trixie DredgeEmily West, PA-C  omeprazole (PRILOSEC) 20 MG capsule Take 1 capsule (20 mg total) by mouth daily. 04/07/15  Yes Tiffany Netta CedarsS Noel, PA-C  amoxicillin-clavulanate (AUGMENTIN) 875-125 MG per tablet Take 1 tablet by mouth 2 (two) times daily. Patient not taking: Reported on 04/07/2015 02/01/15   Doris Cheadleeepak Advani, MD  cetirizine (ZYRTEC) 10 MG tablet Take 1 tablet (10 mg total) by mouth daily. Patient not taking: Reported on 04/07/2015 08/24/14   Doris Cheadleeepak Advani, MD  cyclobenzaprine (FLEXERIL) 10 MG tablet Take 1 tablet (10 mg total) by mouth 2 (two) times daily as needed for muscle spasms. Patient not taking: Reported on 09/16/2014 05/19/14   Tatyana Kirichenko, PA-C  loratadine (CLARITIN) 10 MG tablet Take 1 tablet (10 mg total) by mouth daily. Patient not taking: Reported on 04/07/2015 11/17/14   Doris Cheadleeepak Advani, MD  simethicone (GAS-X) 80 MG chewable tablet Chew 1 tablet (80 mg total) by mouth every 6 (six) hours as needed for flatulence.  04/07/15   Vivianne Master, PA-C    Assessment: 1. Bloating/gas 2. Abdominal pain  Plan: Gas X Refill PPI Counseled on foods to avoid with hx lactose intol Work note provided  Follow up:as scheduled  The patient was given clear instructions to go to ER or return to medical center if symptoms don't improve, worsen or new problems develop. The patient verbalized understanding. The patient was told to call to get lab results if they haven't heard anything in the next week.   This  note has been created with Education officer, environmental. Any transcriptional errors are unintentional.   Scot Jun, PA-C 04/07/2015, 10:48 AM

## 2015-04-13 ENCOUNTER — Ambulatory Visit: Payer: Self-pay | Attending: Internal Medicine

## 2015-06-14 ENCOUNTER — Ambulatory Visit: Payer: Self-pay | Attending: Internal Medicine

## 2015-06-17 ENCOUNTER — Ambulatory Visit: Payer: Self-pay | Attending: Internal Medicine

## 2015-07-26 ENCOUNTER — Encounter: Payer: Self-pay | Admitting: Family Medicine

## 2015-07-26 ENCOUNTER — Ambulatory Visit: Payer: Self-pay | Attending: Family Medicine | Admitting: Family Medicine

## 2015-07-26 VITALS — BP 139/84 | HR 78 | Temp 98.7°F | Resp 16 | Ht 67.0 in | Wt 202.0 lb

## 2015-07-26 DIAGNOSIS — Z9109 Other allergy status, other than to drugs and biological substances: Secondary | ICD-10-CM

## 2015-07-26 DIAGNOSIS — R1013 Epigastric pain: Secondary | ICD-10-CM | POA: Insufficient documentation

## 2015-07-26 DIAGNOSIS — H6123 Impacted cerumen, bilateral: Secondary | ICD-10-CM

## 2015-07-26 DIAGNOSIS — Z23 Encounter for immunization: Secondary | ICD-10-CM | POA: Insufficient documentation

## 2015-07-26 DIAGNOSIS — Z0000 Encounter for general adult medical examination without abnormal findings: Secondary | ICD-10-CM

## 2015-07-26 DIAGNOSIS — K219 Gastro-esophageal reflux disease without esophagitis: Secondary | ICD-10-CM | POA: Insufficient documentation

## 2015-07-26 DIAGNOSIS — Z91048 Other nonmedicinal substance allergy status: Secondary | ICD-10-CM

## 2015-07-26 DIAGNOSIS — H6120 Impacted cerumen, unspecified ear: Secondary | ICD-10-CM | POA: Insufficient documentation

## 2015-07-26 DIAGNOSIS — Z79899 Other long term (current) drug therapy: Secondary | ICD-10-CM | POA: Insufficient documentation

## 2015-07-26 DIAGNOSIS — J301 Allergic rhinitis due to pollen: Secondary | ICD-10-CM | POA: Insufficient documentation

## 2015-07-26 DIAGNOSIS — Z87891 Personal history of nicotine dependence: Secondary | ICD-10-CM | POA: Insufficient documentation

## 2015-07-26 MED ORDER — OMEPRAZOLE 20 MG PO CPDR: 20.0000 mg | DELAYED_RELEASE_CAPSULE | Freq: Every day | ORAL | Status: DC

## 2015-07-26 MED ORDER — CARBAMIDE PEROXIDE 6.5 % OT SOLN: 5.0000 [drp] | Freq: Two times a day (BID) | OTIC | Status: DC

## 2015-07-26 MED ORDER — CETIRIZINE HCL 10 MG PO TABS: 10.0000 mg | ORAL_TABLET | Freq: Every day | ORAL | Status: DC

## 2015-07-26 NOTE — Patient Instructions (Addendum)
Michael Daugherty was seen today for uri.  Diagnoses and all orders for this visit:  Dyspepsia -     omeprazole (PRILOSEC) 20 MG capsule; Take 1 capsule (20 mg total) by mouth daily before supper. Take 30 minutes before supper  Environmental allergies -     cetirizine (ZYRTEC) 10 MG tablet; Take 1 tablet (10 mg total) by mouth daily.  Excess ear wax, bilateral -     carbamide peroxide (DEBROX) 6.5 % otic solution; Place 5 drops into both ears 2 (two) times daily. For 5 days  Other orders -     Cancel: HIV antibody (with reflex)   F/u in 3 months   Dr. Armen PickupFunches   Food Choices for Gastroesophageal Reflux Disease, Adult When you have gastroesophageal reflux disease (GERD), the foods you eat and your eating habits are very important. Choosing the right foods can help ease the discomfort of GERD. WHAT GENERAL GUIDELINES DO I NEED TO FOLLOW?  Choose fruits, vegetables, whole grains, low-fat dairy products, and low-fat meat, fish, and poultry.  Limit fats such as oils, salad dressings, butter, nuts, and avocado.  Keep a food diary to identify foods that cause symptoms.  Avoid foods that cause reflux. These may be different for different people.  Eat frequent small meals instead of three large meals each day.  Eat your meals slowly, in a relaxed setting.  Limit fried foods.  Cook foods using methods other than frying.  Avoid drinking alcohol.  Avoid drinking large amounts of liquids with your meals.  Avoid bending over or lying down until 2-3 hours after eating. WHAT FOODS ARE NOT RECOMMENDED? The following are some foods and drinks that may worsen your symptoms: Vegetables Tomatoes. Tomato juice. Tomato and spaghetti sauce. Chili peppers. Onion and garlic. Horseradish. Fruits Oranges, grapefruit, and lemon (fruit and juice). Meats High-fat meats, fish, and poultry. This includes hot dogs, ribs, ham, sausage, salami, and bacon. Dairy Whole milk and chocolate milk. Sour cream.  Cream. Butter. Ice cream. Cream cheese.  Beverages Coffee and tea, with or without caffeine. Carbonated beverages or energy drinks. Condiments Hot sauce. Barbecue sauce.  Sweets/Desserts Chocolate and cocoa. Donuts. Peppermint and spearmint. Fats and Oils High-fat foods, including [place] fries and potato chips. Other Vinegar. Strong spices, such as black pepper, white pepper, red pepper, cayenne, curry powder, cloves, ginger, and chili powder. The items listed above may not be a complete list of foods and beverages to avoid. Contact your dietitian for more information.   This information is not intended to replace advice given to you by your health care provider. Make sure you discuss any questions you have with your health care provider.   Document Released: 09/24/2005 Document Revised: 10/15/2014 Document Reviewed: 07/29/2013 Elsevier Interactive Patient Education [employer]2016 Elsevier Inc.

## 2015-07-26 NOTE — Progress Notes (Signed)
C/C cold Sx  Nose congestion, no cough Stomach problems, gas and constipation  No Hx tobacco  No pain

## 2015-07-26 NOTE — Progress Notes (Signed)
Patient ID: Michael Daugherty, male   DOB: [birth year], 39 y.o.   MRN: [id number]   Subjective:  Patient ID: Michael Daugherty, male    DOB: [birth year]  Age: 39 y.o. MRN: [id number]  CC: URI   HPI Michael Daugherty presents for    1. Gi upset: has GERD. Not taking PPI. Acid foods cause GI upset. No weight loss or fever.   2. URI: x 2 week. Congestion. No cough, fever or sore throat.   3. Excessive ear wax: this is chronic. No hearing loss or ear pain.   Social History  Substance Use Topics  . Smoking status: Former Games developermoker  . Smokeless tobacco: Not on file  . Alcohol Use: No    Outpatient Prescriptions Prior to Visit  Medication Sig Dispense Refill  . cetirizine (ZYRTEC) 10 MG tablet Take 1 tablet (10 mg total) by mouth daily. 30 tablet 3  . amoxicillin-clavulanate (AUGMENTIN) 875-125 MG per tablet Take 1 tablet by mouth 2 (two) times daily. (Patient not taking: Reported on 04/07/2015) 20 tablet 0  . carbamide peroxide (DEBROX) 6.5 % otic solution Place 5 drops into both ears 2 (two) times daily. (Patient not taking: Reported on 07/26/2015) 15 mL 1  . cyclobenzaprine (FLEXERIL) 10 MG tablet Take 1 tablet (10 mg total) by mouth 2 (two) times daily as needed for muscle spasms. (Patient not taking: Reported on 09/16/2014) 10 tablet 0  . fluticasone (FLONASE) 50 MCG/ACT nasal spray Place 2 sprays into both nostrils daily. (Patient not taking: Reported on 07/26/2015) 16 g 6  . hydrocortisone 2.5 % cream Apply topically 2 (two) times daily. (Patient not taking: Reported on 07/26/2015) 30 g 2  . hydrocortisone cream 1 % Apply to affected area 2 times daily (Patient not taking: Reported on 07/26/2015) 15 g 0  . hydrOXYzine (ATARAX/VISTARIL) 10 MG tablet Take 1 tablet (10 mg total) by mouth 3 (three) times daily as needed for itching. (Patient not taking: Reported on 07/26/2015) 30 tablet 3  . ibuprofen (ADVIL,MOTRIN) 600 MG tablet Take 1 tablet (600 mg total) by mouth every 6 (six) hours as needed.  (Patient not taking: Reported on 07/26/2015) 30 tablet 0  . loratadine (CLARITIN) 10 MG tablet Take 1 tablet (10 mg total) by mouth daily. (Patient not taking: Reported on 04/07/2015) 30 tablet 3  . neomycin-polymyxin-hydrocortisone (CORTISPORIN) otic solution Place 3 drops into the right ear 3 (three) times daily. (Patient not taking: Reported on 07/26/2015) 10 mL 0  . omeprazole (PRILOSEC) 20 MG capsule Take 1 capsule (20 mg total) by mouth daily. (Patient not taking: Reported on 07/26/2015) 30 capsule 3  . simethicone (GAS-X) 80 MG chewable tablet Chew 1 tablet (80 mg total) by mouth every 6 (six) hours as needed for flatulence. (Patient not taking: Reported on 07/26/2015) 30 tablet 0   No facility-administered medications prior to visit.    ROS Review of Systems  Constitutional: Negative for fever, chills, fatigue and unexpected weight change.  HENT: Positive for congestion, sinus pressure and sore throat.   Eyes: Negative for visual disturbance.  Respiratory: Negative for cough and shortness of breath.   Cardiovascular: Negative for chest pain, palpitations and leg swelling.  Gastrointestinal: Positive for diarrhea, constipation and abdominal distention. Negative for nausea, vomiting, abdominal pain, blood in stool, anal bleeding and rectal pain.  Endocrine: Negative for polydipsia, polyphagia and polyuria.  Musculoskeletal: Negative for myalgias, back pain, arthralgias, gait problem and neck pain.  Skin: Negative for rash.  Allergic/Immunologic: Negative for immunocompromised state.  Hematological: Negative  for adenopathy. Does not bruise/bleed easily.  Psychiatric/Behavioral: Negative for suicidal ideas, sleep disturbance and dysphoric mood. The patient is not nervous/anxious.     Objective:  BP 139/84 mmHg  Pulse 78  Temp(Src) 98.7 F (37.1 C) (Oral)  Resp 16  Ht  (1.702 m)  Wt 202 lb (91.627 kg)  BMI 31.63 kg/m2  SpO2 100%  BP/Weight 07/26/2015 04/07/2015 02/01/2015    Systolic BP 139 111 135  Diastolic BP 84 75 85  Wt. (Lbs) 202 199 195.8  BMI 31.63 31.16 30.66   Physical Exam  Constitutional: He appears well-developed and well-nourished. No distress.  HENT:  Head: Normocephalic and atraumatic.  Nose: Mucosal edema present.  Cerumen impaction in both ears   Neck: Normal range of motion. Neck supple.  Cardiovascular: Normal rate, regular rhythm, normal heart sounds and intact distal pulses.   Pulmonary/Chest: Effort normal and breath sounds normal.  Musculoskeletal: He exhibits no edema.  Neurological: He is alert.  Skin: Skin is warm and dry. No rash noted. No erythema.  Psychiatric: He has a normal mood and affect.     Assessment & Plan:   Problem List Items Addressed This Visit    Dyspepsia (Chronic)   Relevant Medications   omeprazole (PRILOSEC) 20 MG capsule   Environmental allergies   Relevant Medications   cetirizine (ZYRTEC) 10 MG tablet   Excess ear wax (Chronic)   Relevant Medications   carbamide peroxide (DEBROX) 6.5 % otic solution    Other Visit Diagnoses    Health care maintenance    -  Primary    Relevant Orders    Flu Vaccine QUAD 36+ mos PF IM (Fluarix & Fluzone Quad PF) (Completed)       No orders of the defined types were placed in this encounter.    Follow-up: No Follow-up on file.   Dessa Phi MD

## 2015-10-17 ENCOUNTER — Ambulatory Visit: Payer: Self-pay | Admitting: Family Medicine

## 2015-10-19 ENCOUNTER — Encounter: Payer: Self-pay | Admitting: Family Medicine

## 2015-10-19 ENCOUNTER — Ambulatory Visit: Payer: Self-pay | Attending: Family Medicine | Admitting: Family Medicine

## 2015-10-19 VITALS — BP 125/77 | HR 63 | Temp 98.4°F | Resp 16 | Ht 66.0 in | Wt 196.0 lb

## 2015-10-19 DIAGNOSIS — Z9109 Other allergy status, other than to drugs and biological substances: Secondary | ICD-10-CM

## 2015-10-19 DIAGNOSIS — Z91048 Other nonmedicinal substance allergy status: Secondary | ICD-10-CM

## 2015-10-19 DIAGNOSIS — K0889 Other specified disorders of teeth and supporting structures: Secondary | ICD-10-CM | POA: Insufficient documentation

## 2015-10-19 DIAGNOSIS — K047 Periapical abscess without sinus: Secondary | ICD-10-CM

## 2015-10-19 DIAGNOSIS — K5900 Constipation, unspecified: Secondary | ICD-10-CM | POA: Insufficient documentation

## 2015-10-19 MED ORDER — TRAMADOL HCL 50 MG PO TABS: 50.0000 mg | ORAL_TABLET | Freq: Two times a day (BID) | ORAL | Status: DC | PRN

## 2015-10-19 MED ORDER — POLYETHYLENE GLYCOL 3350 17 GM/SCOOP PO POWD: 17.0000 g | Freq: Two times a day (BID) | ORAL | Status: DC | PRN

## 2015-10-19 MED ORDER — CETIRIZINE HCL 10 MG PO TABS: 10.0000 mg | ORAL_TABLET | Freq: Every day | ORAL | Status: DC

## 2015-10-19 MED FILL — POLYETHYLENE GLYCOL 3350: 30 days supply | Qty: 1020 | Fill #0

## 2015-10-19 MED FILL — ?CETIRIZINE HCL 10 MG TABLE: 10 | 30 days supply | Qty: 30 | Fill #0

## 2015-10-19 MED FILL — traMADol HCL 50 MG TABS: 50 | 15 days supply | Qty: 30 | Fill #0

## 2015-10-19 NOTE — Progress Notes (Signed)
Patient's here for stomach constipation, having pain, rated 10/10, off and on.  Patient needs referral to dentist.   Patient states he's having dental pain with infxn in his top tooth.  Patients having moderate gas, bloating, constipated, diarrhea since 2014.   Allergies with mucus, no color, but a lot of phlegm.

## 2015-10-19 NOTE — Progress Notes (Addendum)
   Subjective:    Patient ID: Michael Daugherty, male    DOB: 1976/07/01, 40 y.o.   MRN: [id number]  HPI Patient with concerns for dental pain. Upper R incisor. Has referral already made and is coming up with the $30 copay to have it pulled. Still painful for 2 weeks, better than then.  No fevers or chills. Able to eat.   Constipation-- longstanding, all his life. Off and on. Belly pain when cant go to the bathroom. Last week took laxative (from dollar store), helps.  Last BM today. Hard, no blood. Occasional N/V off and on, not much.  Had colonscopy, told negative in [place] around 2000-2001.   Allergies-- mucus and phlegm all year long. Claritin and Zyrtec help. More nasal sxs, not cough. Nonsmoker. No pets. There is dust in the house. Not sure of triggers. No asthma as a child.   Family Hx Father and brother died of cancer; cousin died of cancer (unknown which kind of cancer).  DM-- mother, brother.    Social Hx nonsmoker, no alcohol use. Moved to [place] from [place].   Review of Systems  No fever or chills, no N/V now. Able to eat despite tooth pain. See above.      Objective:   Physical Exam GENERAL Well appearing, no distress HEENT Neck supple, no cervical adenopathy. Dentition poor; broken upper incisor, no surrounding gum redness or purulence. Clear oropharynx. Able to open mouth fully.  PERRL EOMI. Nasal mucosa with watery discharge. Boggy nasal mucosa. TMs clear.  COR regular S1S2 PULM Clear bilaterally ABD Soft, nontender, nondistended. No masses or megaly. No guarding.        Assessment & Plan:  Dental abscess-- Upper incisor. Coming up with copay for dental extraction. Reports that he would like something for pain, Tramadol BID prescribed. 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 Has had c-scope in [place] for this, reports this was negative. He is instructed on treatment, and need to return if not resolved in 2 weeks, as he may need  additional workup including endoscopy. He voices understanding of this treatment plan.

## 2015-10-19 NOTE — Patient Instructions (Signed)
It is a pleasure to see you today.   For the constipation, I recommend you to take MIRALAX powder, 1 tablespoon in water, by mouth one or two times daily.    Be sure to sit on the toilet every day at the same time, even if you do not feel you need to use the bathroom.   Follow up with this office if your symptoms are not better after 2 weeks of the above.   For the nasal congestion, I sent a prescription for the Zyrtec 10mg  tablets one tablet by mouth one time daily to the [facility].   For the dental pain, Tramadol 50mg  one tablet by mouth every 12 hours as needed only for extreme pain. The definitive treatment for this problem is to have your tooth pulled by the dentist.

## 2015-10-20 ENCOUNTER — Ambulatory Visit: Payer: Self-pay | Admitting: Family Medicine

## 2015-10-26 ENCOUNTER — Telehealth: Payer: Self-pay | Admitting: Family Medicine

## 2015-10-26 DIAGNOSIS — K047 Periapical abscess without sinus: Secondary | ICD-10-CM

## 2015-10-26 NOTE — Telephone Encounter (Signed)
Pt. Called requesting a dental referral. Please f/u with pt. °

## 2015-10-27 NOTE — Telephone Encounter (Signed)
Patient called requesting a Dental referral. Patient had an appointment on 1/11 in regards to dental referral, but patient stated they declined the referral and told the doctor he had a dentist. Please follow up.

## 2015-10-28 NOTE — Telephone Encounter (Signed)
Dental referral placed  Referral coordinator will follow up with patient

## 2015-11-09 ENCOUNTER — Telehealth: Payer: Self-pay | Admitting: Family Medicine

## 2015-11-09 DIAGNOSIS — K5900 Constipation, unspecified: Secondary | ICD-10-CM

## 2015-11-09 NOTE — Telephone Encounter (Signed)
Patient was contacted by GI and states he was informed he needed a referral in place to make appointment.Michael KitchenMarland KitchenMarland Daugherty

## 2015-11-10 ENCOUNTER — Encounter: Payer: Self-pay | Admitting: Physician Assistant

## 2015-11-10 NOTE — Telephone Encounter (Signed)
GI referral placed Patient will be contacted  

## 2015-11-11 NOTE — Telephone Encounter (Signed)
Noted.  Dr. Armen Pickup

## 2015-11-11 NOTE — Telephone Encounter (Signed)
Dr. Hyman Hopes and I discussed this; I have addended the note to include A&P in the main progress note, rather than under the Problem List (as it has historically been done at [facility]).   JB

## 2015-12-06 IMAGING — CR DG CHEST 2V
2 series · 2 of 2 positions shown · non-contrast
Comparison: November 28, 2013

CLINICAL DATA: Chest pain

EXAM:
CHEST  2 VIEW

[w chest pa]
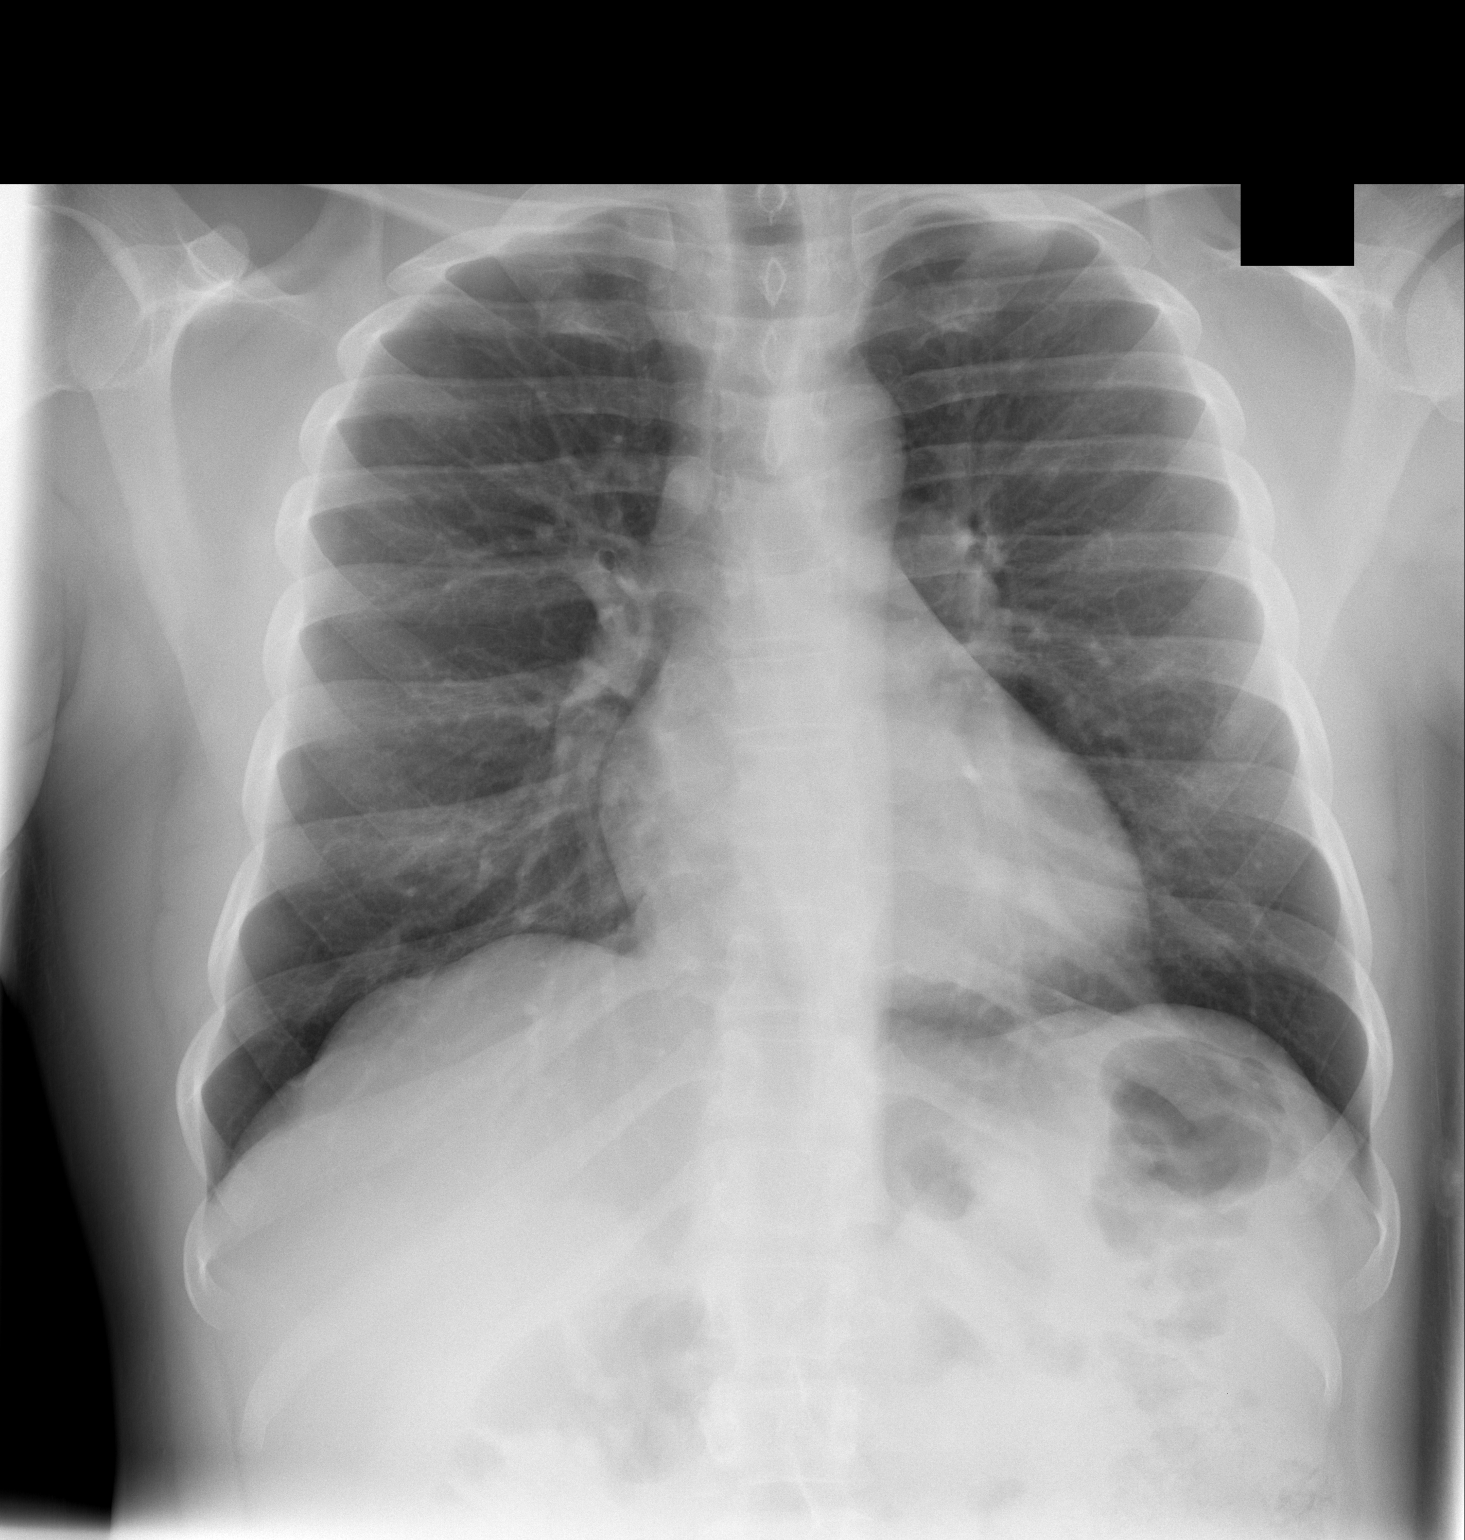

[w chest lat]
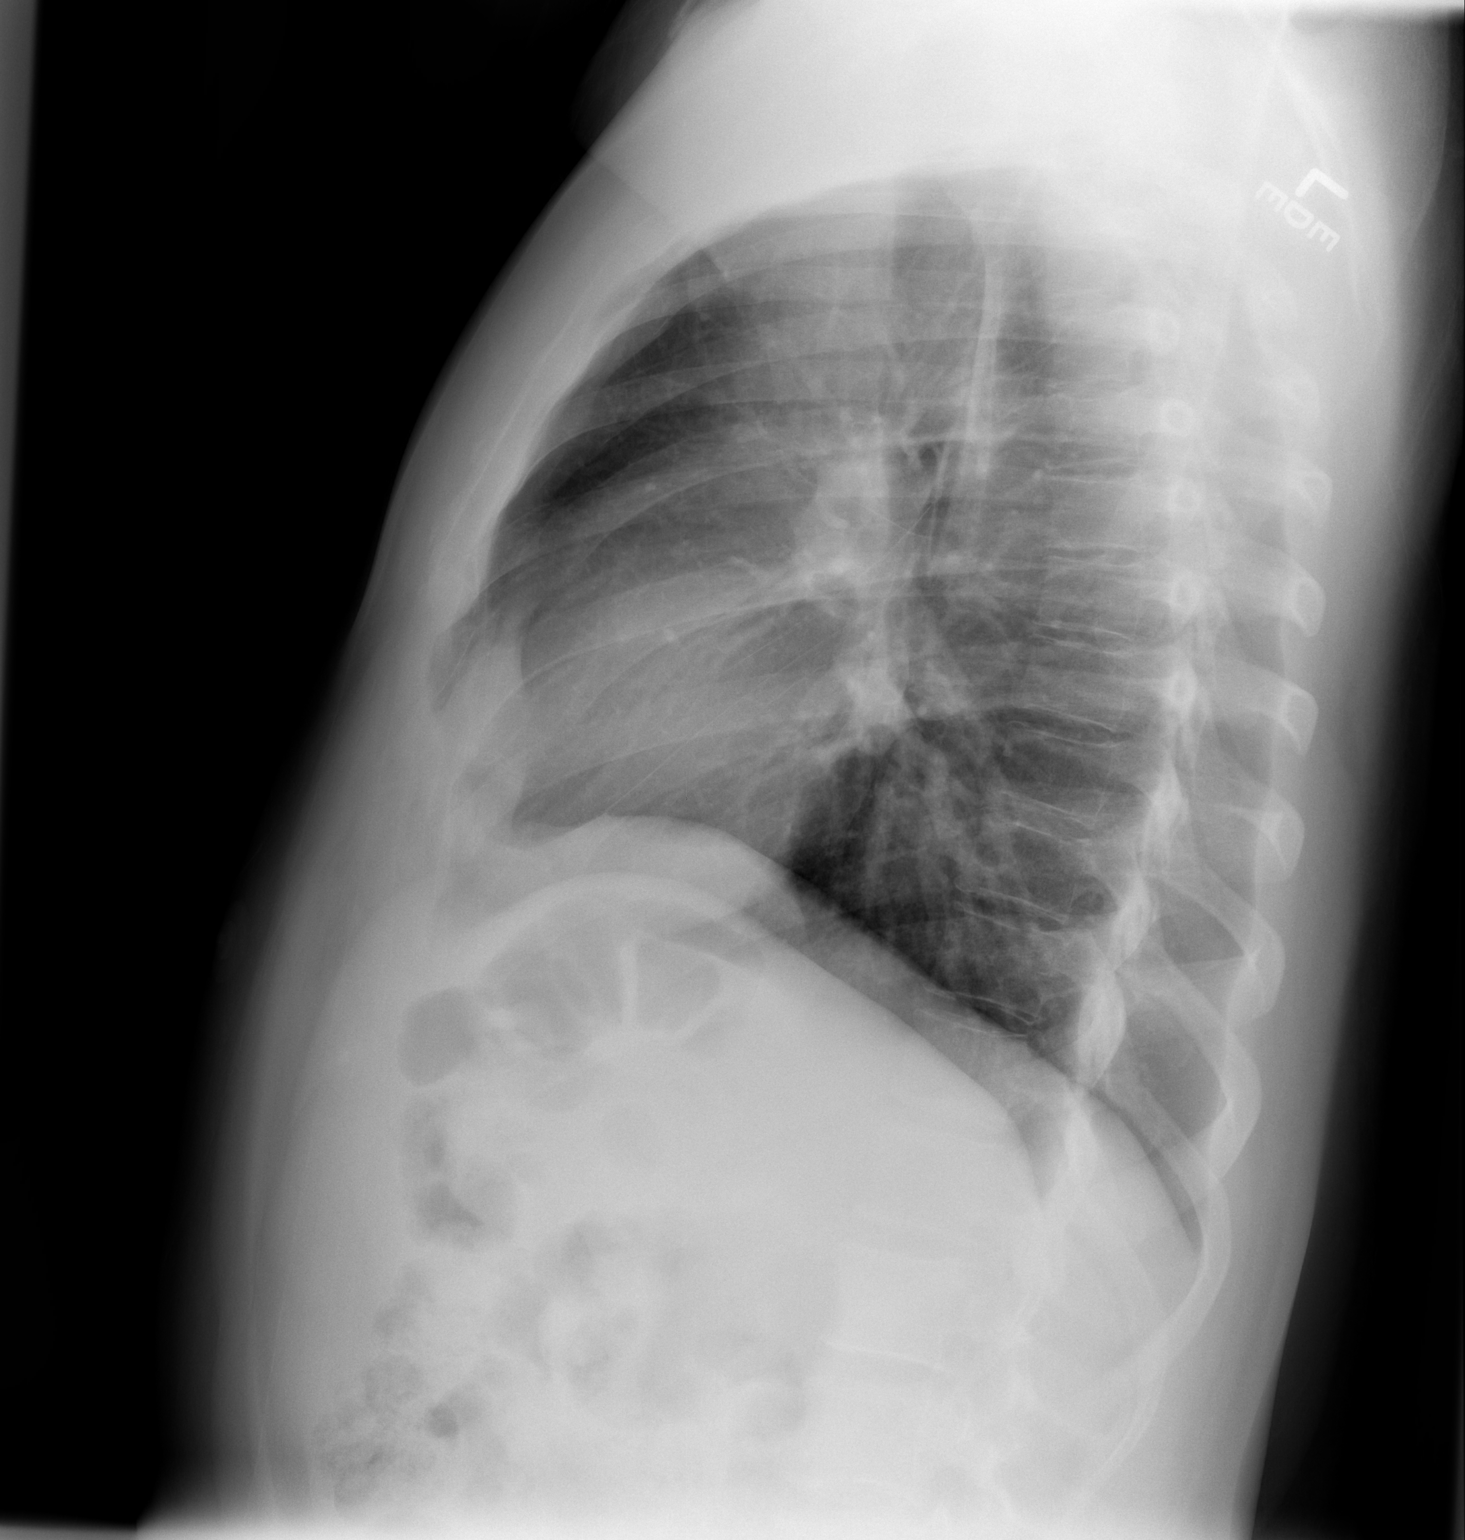

[2 of 2 positions shown; findings below may reference images not displayed]

FINDINGS: Lungs are clear. Heart size and pulmonary vascularity are normal. No
adenopathy. No pneumothorax. No bone lesions.
IMPRESSION: No abnormality noted.

## 2015-12-09 ENCOUNTER — Ambulatory Visit: Payer: Self-pay | Admitting: Physician Assistant

## 2015-12-21 ENCOUNTER — Encounter: Payer: Self-pay | Admitting: Gastroenterology

## 2015-12-21 ENCOUNTER — Telehealth: Payer: Self-pay | Admitting: Gastroenterology

## 2015-12-21 ENCOUNTER — Ambulatory Visit (INDEPENDENT_AMBULATORY_CARE_PROVIDER_SITE_OTHER): Payer: Self-pay | Admitting: Gastroenterology

## 2015-12-21 VITALS — BP 130/70 | HR 82 | Ht 66.0 in | Wt 202.0 lb

## 2015-12-21 DIAGNOSIS — K5900 Constipation, unspecified: Secondary | ICD-10-CM

## 2015-12-21 DIAGNOSIS — K219 Gastro-esophageal reflux disease without esophagitis: Secondary | ICD-10-CM

## 2015-12-21 MED ORDER — POLYETHYLENE GLYCOL 3350 17 GM/SCOOP PO POWD: 17.0000 g | Freq: Every day | ORAL | Status: DC

## 2015-12-21 MED ORDER — PANTOPRAZOLE SODIUM 40 MG PO TBEC: 40.0000 mg | DELAYED_RELEASE_TABLET | Freq: Every day | ORAL | Status: DC

## 2015-12-21 NOTE — Progress Notes (Signed)
Michael Daugherty    [id number]    [birth year]  Primary Care Physician:[name], Ellison CarwinJOSALYN C, MD  Referring Physician: Dessa PhiJosalyn [name], MD [address] [place] [id number]  Chief complaint:  Constipation, GERD  HPI: 40 year old male here for new patient visit with complaints of chronic constipation, abdominal bloating and flatulence. [place]. Per patient he had a colonoscopy 2 or 3 years ago and was normal, report or previous records not available during this office visit. He currently has a bowel movement every 2-3 days and is not on any laxatives. Before he was using MiraLAX one capful daily with regular bowel movements.  On review of systems complained of daily heartburn, he was taking omeprazole in the past with no significant improvement. Denies any dysphagia or odynophagia. No abdominal pain, diarrhea or blood per rectum. He has gained some weight and is trying to lose weight. His mother is diagnosed with cancer but he is not sure which organ system is involved  Outpatient Encounter Prescriptions as of 12/21/2015  Medication Sig  . carbamide peroxide (DEBROX) 6.5 % otic solution Place 5 drops into both ears 2 (two) times daily. For 5 days  . cetirizine (ZYRTEC) 10 MG tablet Take 1 tablet (10 mg total) by mouth daily.  [name]. omeprazole (PRILOSEC) 20 MG capsule Take 1 capsule (20 mg total) by mouth daily before supper. Take 30 minutes before supper  . polyethylene glycol powder (GLYCOLAX/MIRALAX) powder Take 17 g by mouth 2 (two) times daily as needed.  . traMADol (ULTRAM) 50 MG tablet Take 1 tablet (50 mg total) by mouth every 12 (twelve) hours as needed.   No facility-administered encounter medications on file as of 12/21/2015.    Allergies as of 12/21/2015 - Review Complete 10/19/2015  Allergen Reaction Noted  . Other Hives 11/28/2013  . Oysters [shellfish allergy] Hives 11/28/2013  . Soybean-containing drug products  Hives 11/28/2013  . Strawberry extract Hives 11/28/2013    Past Medical History  Diagnosis Date  . Attention deficit disorder (ADD)   . Seasonal allergies     Past Surgical History  Procedure Laterality Date  . Rhinoplasty      Family History  Problem Relation Age of Onset  . Diabetes Mother   . Cancer Father   . Diabetes Brother     Social History   Social History  . Marital Status: Single    Spouse Name: N/A  . Number of Children: N/A  . Years of Education: N/A   Occupational History  . Not on file.   Social History Main Topics  . Smoking status: Former Games developermoker  . Smokeless tobacco: Not on file  . Alcohol Use: No  . Drug Use: No  . Sexual Activity: Not on file   Other Topics Concern  . Not on file   Social History Narrative      Review of systems: Review of Systems  Constitutional: Negative for fever and chills.  HENT: Negative.   Eyes: Negative for blurred vision.  Respiratory: Negative for cough, shortness of breath and wheezing.   Cardiovascular: Negative for chest pain and palpitations.  Gastrointestinal: as per HPI Genitourinary: Negative for dysuria, urgency, frequency and hematuria.  Musculoskeletal: Negative for myalgias, back pain and joint pain.  Skin: Negative for itching and rash.  Neurological: Negative for dizziness, tremors, focal weakness, seizures and loss of consciousness.  Endo/Heme/Allergies: Negative for environmental allergies.  Psychiatric/Behavioral: Negative for depression,  suicidal ideas and hallucinations.  All other systems reviewed and are negative.   Physical Exam: Filed Vitals:   12/21/15 0832  BP: 130/70  Pulse: 82   Gen:      No acute distress HEENT:  EOMI, sclera anicteric Neck:     No masses; no thyromegaly Lungs:    Clear to auscultation bilaterally; normal respiratory effort CV:         Regular rate and rhythm; no murmurs Abd:      + bowel sounds; soft, non-tender; no palpable masses, no  distension Ext:    No edema; adequate peripheral perfusion Skin:      Warm and dry; no rash Neuro: alert and oriented x 3 Psych: normal mood and affect  Data Reviewed:  Reviewed his chart in epic   Assessment and Plan/Recommendations: 40 year old male with complaints of chronic constipation and GERD is here to establish care with a new patient Start Miralax 1 capful daily and titrate up or down based on symptoms to have 1 soft bowel movement a day Advised patient to increase dietary fiber and fluid intake Start Protonix 40 mg once a day, 30 minutes before breakfast Follow antireflux measures We'll try to obtain his previous colonoscopy report and medical records from his previous GI Return in 6 months  K. Scherry Ran , MD [phone] Mon-Fri 8a-5p [phone] after 5p, weekends, holidays

## 2015-12-21 NOTE — Patient Instructions (Addendum)
We will try and obtain your colonoscopy report from [place]  Use Miralax 1 capful daily We will send Protonix to your pharmacy  Discontinue prilosec  Follow up in 6 months

## 2015-12-21 NOTE — Telephone Encounter (Signed)
Noted   Added in his family medical history

## 2015-12-22 MED FILL — PANTOPRAZOLE SOD DR 40 MG T: 40 | 30 days supply | Qty: 30 | Fill #0

## 2015-12-22 MED FILL — POLYETHYLENE GLYCOL 3350: 30 days supply | Qty: 1020 | Fill #1

## 2016-01-19 ENCOUNTER — Encounter (HOSPITAL_COMMUNITY): Payer: Self-pay

## 2016-01-19 ENCOUNTER — Emergency Department (HOSPITAL_COMMUNITY)
Admission: EM | Admit: 2016-01-19 | Discharge: 2016-01-20 | Disposition: A | Discharge status: Other Acute Inpatient Hospital | Payer: Federal, State, Local not specified - Other

## 2016-01-19 DIAGNOSIS — Z0289 Encounter for other administrative examinations: Secondary | ICD-10-CM | POA: Insufficient documentation

## 2016-01-19 DIAGNOSIS — R454 Irritability and anger: Secondary | ICD-10-CM | POA: Insufficient documentation

## 2016-01-19 DIAGNOSIS — Z87891 Personal history of nicotine dependence: Secondary | ICD-10-CM | POA: Insufficient documentation

## 2016-01-19 DIAGNOSIS — F323 Major depressive disorder, single episode, severe with psychotic features: Secondary | ICD-10-CM | POA: Diagnosis present

## 2016-01-19 DIAGNOSIS — F6381 Intermittent explosive disorder: Secondary | ICD-10-CM | POA: Diagnosis present

## 2016-01-19 DIAGNOSIS — Z79899 Other long term (current) drug therapy: Secondary | ICD-10-CM | POA: Insufficient documentation

## 2016-01-19 DIAGNOSIS — F419 Anxiety disorder, unspecified: Secondary | ICD-10-CM | POA: Insufficient documentation

## 2016-01-19 DIAGNOSIS — Z046 Encounter for general psychiatric examination, requested by authority: Secondary | ICD-10-CM
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
## 2016-01-19 NOTE — ED Provider Notes (Signed)
CSN: [id number]     Arrival date & time 01/19/16  2246 History  By signing my name below, I, Michael Daugherty, attest that this documentation has been prepared under the direction and in the presence of [employer], PA-C.   Electronically Signed: Iona Daugherty, ED Scribe 01/19/2016 at 6:05 AM.   Chief Complaint  Patient presents with  . IVC    The history is provided by the patient. No language interpreter was used.   HPI Comments: Michael Daugherty is a 40 y.o. male who presents to the Emergency Department for medical clearance following an argument with a family member earlier tonight. He states that she was in an argument with his mom and pulled the rug out from under her. His mother states "she is scared to be alone with him". He reports that he also cursed at his mom and was angry due to family issues and not being able to find a job. No other associated symptoms noted. No other worsening or alleviating factors noted. Pt denies suicidal ideations, homicidal ideations, or any other pertinent symptoms. Pt has never seen a therapist or psychiatrist.    Past Medical History  Diagnosis Date  . Attention deficit disorder (ADD)   . Seasonal allergies    Past Surgical History  Procedure Laterality Date  . Rhinoplasty     Family History  Problem Relation Age of Onset  . Diabetes Mother   . Cancer Father   . Diabetes Brother   . Stomach cancer Mother    Social History  Substance Use Topics  . Smoking status: Former Games developer  . Smokeless tobacco: None  . Alcohol Use: No    Review of Systems  Neurological: Negative for speech difficulty.  Psychiatric/Behavioral: Positive for behavioral problems. Negative for suicidal ideas and self-injury.  All other systems reviewed and are negative.   Allergies  Other; Oysters; Soybean-containing drug products; and Strawberry extract  Home Medications   Prior to Admission medications   Medication Sig Start Date End Date Taking?  Authorizing Provider  pantoprazole (PROTONIX) 40 MG tablet Take 1 tablet (40 mg total) by mouth daily. 12/21/15  Yes Napoleon Form, MD  polyethylene glycol powder (GLYCOLAX/MIRALAX) powder Take 17 g by mouth daily. Patient taking differently: Take 17 g by mouth daily as needed for mild constipation or moderate constipation.  12/21/15  Yes Napoleon Form, MD  carbamide peroxide (DEBROX) 6.5 % otic solution Place 5 drops into both ears 2 (two) times daily. For 5 days Patient not taking: Reported on 01/19/2016 07/26/15   Dessa Phi, MD  cetirizine (ZYRTEC) 10 MG tablet Take 1 tablet (10 mg total) by mouth daily. Patient not taking: Reported on 01/19/2016 10/19/15   Barbaraann Barthel, MD  traMADol (ULTRAM) 50 MG tablet Take 1 tablet (50 mg total) by mouth every 12 (twelve) hours as needed. Patient not taking: Reported on 01/19/2016 10/19/15   Barbaraann Barthel, MD   BP 129/82 mmHg  Pulse 59  Temp(Src) 98.2 F (36.8 C) (Oral)  Resp 16  SpO2 100%  Physical Exam  Constitutional: He is oriented to person, place, and time. He appears well-developed and well-nourished. No distress.  HENT:  Head: Normocephalic and atraumatic.  Eyes: Conjunctivae and EOM are normal. No scleral icterus.  Neck: Normal range of motion.  Pulmonary/Chest: Effort normal. No respiratory distress.  Musculoskeletal: Normal range of motion.  Neurological: He is alert and oriented to person, place, and time. He exhibits normal muscle tone. Coordination normal.  Skin: Skin  is warm and dry. No rash noted. He is not diaphoretic. No erythema. No pallor.  Psychiatric: His speech is normal. His mood appears anxious. He expresses no homicidal and no suicidal ideation.  Nursing note and vitals reviewed.   ED Course  Procedures (including critical care time) DIAGNOSTIC STUDIES: Oxygen Saturation is 100% on RA, normal by my interpretation.    COORDINATION OF CARE: 12:01 AM-Discussed treatment plan with pt at bedside and pt  agreed to plan.   Labs Review Labs Reviewed  COMPREHENSIVE METABOLIC PANEL - Abnormal; Notable for the following:    Glucose, Bld 101 (*)    All other components within normal limits  CBC - Abnormal; Notable for the following:    RBC 6.06 (*)    MCV 71.8 (*)    MCH 23.4 (*)    All other components within normal limits  ETHANOL  URINE RAPID DRUG SCREEN, HOSP PERFORMED    Imaging Review No results found.    EKG Interpretation None      MDM   Final diagnoses:  Irritability and anger  Involuntary commitment    Patient presents under IVC. He has been medically cleared and is pending psychiatric evaluation in the morning to determine disposition. ED disposition to be determined by oncoming ED provider.  I personally performed the services described in this documentation, which was scribed in my presence. The recorded information has been reviewed and is accurate.    Filed Vitals:   01/19/16 2320  BP: 129/82  Pulse: 59  Temp: 98.2 F (36.8 C)  TempSrc: Oral  Resp: 16  SpO2: 100%      Antony MaduraKelly Desia Saban, PA-C 01/20/16 0606  Dione Boozeavid Glick, MD 01/20/16 [phone]

## 2016-01-20 ENCOUNTER — Inpatient Hospital Stay (HOSPITAL_COMMUNITY)
Admission: AD | Admit: 2016-01-20 | Discharge: 2016-01-25 | DRG: 897 | Disposition: A | Discharge status: Home / Self Care | Payer: Federal, State, Local not specified - Other | Source: Intra-hospital | Attending: Psychiatry | Admitting: Psychiatry

## 2016-01-20 ENCOUNTER — Encounter (HOSPITAL_COMMUNITY): Payer: Self-pay | Admitting: *Deleted

## 2016-01-20 DIAGNOSIS — Z800 Family history of malignant neoplasm of digestive organs: Secondary | ICD-10-CM

## 2016-01-20 DIAGNOSIS — F10180 Alcohol abuse with alcohol-induced anxiety disorder: Secondary | ICD-10-CM | POA: Diagnosis present

## 2016-01-20 DIAGNOSIS — F1010 Alcohol abuse, uncomplicated: Secondary | ICD-10-CM | POA: Clinically undetermined

## 2016-01-20 DIAGNOSIS — F12280 Cannabis dependence with cannabis-induced anxiety disorder: Principal | ICD-10-CM | POA: Diagnosis present

## 2016-01-20 DIAGNOSIS — K219 Gastro-esophageal reflux disease without esophagitis: Secondary | ICD-10-CM | POA: Diagnosis present

## 2016-01-20 DIAGNOSIS — E785 Hyperlipidemia, unspecified: Secondary | ICD-10-CM | POA: Diagnosis present

## 2016-01-20 DIAGNOSIS — F19929 Other psychoactive substance use, unspecified with intoxication, unspecified: Secondary | ICD-10-CM | POA: Clinically undetermined

## 2016-01-20 DIAGNOSIS — F19980 Other psychoactive substance use, unspecified with psychoactive substance-induced anxiety disorder: Secondary | ICD-10-CM | POA: Diagnosis not present

## 2016-01-20 DIAGNOSIS — Z833 Family history of diabetes mellitus: Secondary | ICD-10-CM

## 2016-01-20 DIAGNOSIS — G4700 Insomnia, unspecified: Secondary | ICD-10-CM | POA: Diagnosis present

## 2016-01-20 DIAGNOSIS — F6381 Intermittent explosive disorder: Secondary | ICD-10-CM | POA: Diagnosis present

## 2016-01-20 DIAGNOSIS — F332 Major depressive disorder, recurrent severe without psychotic features: Secondary | ICD-10-CM | POA: Diagnosis present

## 2016-01-20 DIAGNOSIS — Z87891 Personal history of nicotine dependence: Secondary | ICD-10-CM | POA: Diagnosis not present

## 2016-01-20 DIAGNOSIS — Z8669 Personal history of other diseases of the nervous system and sense organs: Secondary | ICD-10-CM

## 2016-01-20 DIAGNOSIS — F1220 Cannabis dependence, uncomplicated: Secondary | ICD-10-CM | POA: Clinically undetermined

## 2016-01-20 DIAGNOSIS — F419 Anxiety disorder, unspecified: Secondary | ICD-10-CM | POA: Diagnosis present

## 2016-01-20 DIAGNOSIS — F1020 Alcohol dependence, uncomplicated: Secondary | ICD-10-CM | POA: Clinically undetermined

## 2016-01-20 DIAGNOSIS — K5900 Constipation, unspecified: Secondary | ICD-10-CM | POA: Diagnosis present

## 2016-01-20 DIAGNOSIS — F323 Major depressive disorder, single episode, severe with psychotic features: Secondary | ICD-10-CM

## 2016-01-20 DIAGNOSIS — Z9109 Other allergy status, other than to drugs and biological substances: Secondary | ICD-10-CM

## 2016-01-20 DIAGNOSIS — F333 Major depressive disorder, recurrent, severe with psychotic symptoms: Secondary | ICD-10-CM | POA: Diagnosis not present

## 2016-01-20 LAB — COMPREHENSIVE METABOLIC PANEL
ALK PHOS: 49 U/L (ref 38–126)
ALT: 18 U/L (ref 17–63)
ANION GAP: 9 (ref 5–15)
AST: 19 U/L (ref 15–41)
Albumin: 4.4 g/dL (ref 3.5–5.0)
BUN: 16 mg/dL (ref 6–20)
CALCIUM: 9.1 mg/dL (ref 8.9–10.3)
CHLORIDE: 106 mmol/L (ref 101–111)
CO2: 28 mmol/L (ref 22–32)
Creatinine, Ser: 1.15 mg/dL (ref 0.61–1.24)
GFR calc non Af Amer: >60 mL/min (ref >60)
Glucose, Bld: 101 mg/dL — ABNORMAL HIGH (ref 65–99)
POTASSIUM: 3.9 mmol/L (ref 3.5–5.1)
SODIUM: 143 mmol/L (ref 135–145)
Total Bilirubin: 0.7 mg/dL (ref 0.3–1.2)
Total Protein: 7.7 g/dL (ref 6.5–8.1)

## 2016-01-20 LAB — RAPID URINE DRUG SCREEN, HOSP PERFORMED
AMPHETAMINES: NOT DETECTED
BENZODIAZEPINES: NOT DETECTED
Barbiturates: NOT DETECTED
Cocaine: NOT DETECTED
OPIATES: NOT DETECTED
TETRAHYDROCANNABINOL: NOT DETECTED

## 2016-01-20 LAB — CBC
HCT: 43.5 % (ref 39.0–52.0)
HEMOGLOBIN: 14.2 g/dL (ref 13.0–17.0)
MCH: 23.4 pg — ABNORMAL LOW (ref 26.0–34.0)
MCHC: 32.6 g/dL (ref 30.0–36.0)
MCV: 71.8 fL — AB (ref 78.0–100.0)
PLATELETS: 163 10*3/uL (ref 150–400)
RBC: 6.06 MIL/uL — AB (ref 4.22–5.81)
RDW: 15.1 % (ref 11.5–15.5)
WBC: 6.8 10*3/uL (ref 4.0–10.5)

## 2016-01-20 LAB — ETHANOL: Alcohol, Ethyl (B): <5 mg/dL (ref <5)

## 2016-01-20 MED ORDER — TRAZODONE HCL 100 MG PO TABS
100.0000 mg | ORAL_TABLET | Freq: Every day | ORAL | Status: DC
  Administered 2016-01-20: 100 mg via ORAL
  Filled 2016-01-20 (×4): qty 1

## 2016-01-20 MED ORDER — MAGNESIUM HYDROXIDE 400 MG/5ML PO SUSP: 30.0000 mL | Freq: Every day | ORAL | Status: DC | PRN

## 2016-01-20 MED ORDER — HALOPERIDOL 1 MG PO TABS
0.5000 mg | ORAL_TABLET | Freq: Two times a day (BID) | ORAL | Status: DC
  Administered 2016-01-20: 0.5 mg via ORAL
  Filled 2016-01-20: qty 1

## 2016-01-20 MED ORDER — TRAZODONE HCL 100 MG PO TABS: 100.0000 mg | ORAL_TABLET | Freq: Every day | ORAL | Status: DC

## 2016-01-20 MED ORDER — ALUM & MAG HYDROXIDE-SIMETH 200-200-20 MG/5ML PO SUSP
30.0000 mL | ORAL | Status: DC | PRN
  Administered 2016-01-21: 30 mL via ORAL
  Filled 2016-01-20: qty 30

## 2016-01-20 MED ORDER — ACETAMINOPHEN 325 MG PO TABS: 650.0000 mg | ORAL_TABLET | Freq: Four times a day (QID) | ORAL | Status: DC | PRN

## 2016-01-20 MED ORDER — HALOPERIDOL 0.5 MG PO TABS
0.5000 mg | ORAL_TABLET | Freq: Two times a day (BID) | ORAL | Status: DC
  Administered 2016-01-20 – 2016-01-21 (×2): 0.5 mg via ORAL
  Filled 2016-01-20 (×7): qty 1

## 2016-01-20 MED ORDER — LOPERAMIDE HCL 2 MG PO CAPS
2.0000 mg | ORAL_CAPSULE | Freq: Once | ORAL | Status: AC
  Administered 2016-01-20: 2 mg via ORAL
  Filled 2016-01-20: qty 1

## 2016-01-20 NOTE — ED Notes (Signed)
Psychiatry at bedside.

## 2016-01-20 NOTE — BH Assessment (Addendum)
Tele Assessment Note   Michael Daugherty is an 40 y.o. male presenting to [facility] after being petitioned for involuntary commitment by his mother. Pt stated "I got into a disagreement with my moms". "I am 40 and she is 76". "She was saying things that were getting me mad". "I took the rug and yanked it while she was standing on it". "I was wrong for it". Pt denies SI and HI at this time. Pt is reporting auditory and visual hallucinations. Pt reported that sometimes the voices will tell him that he is better off dead and to kill himself. Pt shared that he is dealing with multiple stressors such as unemployment and trying to find his own place. Pt is endorsing multiple depressive symptoms and shared that his has not been good.  Pt reported that drinks alcohol and smoke THC. Pt denied having a history of mental health treatment. Pt did not report any physical, sexual or emotional abuse at this time.   Diagnosis: Major Depressive Disorder, Single episode with psychotic feature.   Past Medical History:  Past Medical History  Diagnosis Date  . Attention deficit disorder (ADD)   . Seasonal allergies     Past Surgical History  Procedure Laterality Date  . Rhinoplasty      Family History:  Family History  Problem Relation Age of Onset  . Diabetes Mother   . Cancer Father   . Diabetes Brother   . Stomach cancer Mother     Social History:  reports that he has quit smoking. He does not have any smokeless tobacco history on file. He reports that he does not drink alcohol or use illicit drugs.  Additional Social History:  Alcohol / Drug Use History of alcohol / drug use?: Yes  CIWA: CIWA-Ar BP: 129/82 mmHg Pulse Rate: (!) 59 COWS:    PATIENT STRENGTHS: (choose at least two) Average or above average intelligence Communication skills  Allergies:  Allergies  Allergen Reactions  . Other Hives    Pecan nuts  . Oysters [Shellfish Allergy] Hives  . Soybean-Containing Drug Products Hives  .  Strawberry Extract Hives    Home Medications:  (Not in a hospital admission)  OB/GYN Status:  No LMP for male patient.  General Assessment Data Location of Assessment: [facility] TTS Assessment: In system Is this a Tele or Face-to-Face Assessment?: Face-to-Face Is this an Initial Assessment or a Re-assessment for this encounter?: Initial Assessment Marital status: Single Living Arrangements: Parent Can pt return to current living arrangement?: Yes Admission Status: Involuntary Is patient capable of signing voluntary admission?: Yes Referral Source: Self/Family/Friend Insurance type: None      Crisis Care Plan Living Arrangements: Parent Name of Psychiatrist: No provider reported.  Name of Therapist: No provider reported.   Education Status Is patient currently in school?: No  Risk to self with the past 6 months Suicidal Ideation: No Has patient been a risk to self within the past 6 months prior to admission? : No Suicidal Intent: No Has patient had any suicidal intent within the past 6 months prior to admission? : No Is patient at risk for suicide?: No Suicidal Plan?: No Has patient had any suicidal plan within the past 6 months prior to admission? : No Access to Means: No What has been your use of drugs/alcohol within the last 12 months?: Alcohol and THC use reported.  Previous Attempts/Gestures: No How many times?: 0 Other Self Harm Risks: Pt denies Triggers for Past Attempts: None known Intentional Self Injurious Behavior:  None Family Suicide History: No Recent stressful life event(s): Other (Comment) (unemployment) Depression: Yes Depression Symptoms: Tearfulness, Fatigue, Isolating, Guilt, Feeling angry/irritable, Feeling worthless/self pity, Loss of interest in usual pleasures, Despondent Substance abuse history and/or treatment for substance abuse?: Yes Suicide prevention information given to non-admitted patients: Not applicable  Risk to Others within the past  6 months Homicidal Ideation: No Does patient have any lifetime risk of violence toward others beyond the six months prior to admission? : No Thoughts of Harm to Others: No Current Homicidal Intent: No Current Homicidal Plan: No Access to Homicidal Means: No Identified Victim: N/A History of harm to others?: No Assessment of Violence: None Noted Violent Behavior Description: No violent behaviors observed.  Does patient have access to weapons?: No Criminal Charges Pending?: No Does patient have a court date: No Is patient on probation?: No  Psychosis Hallucinations: Auditory, Visual Delusions: None noted  Mental Status Report Appearance/Hygiene: Unremarkable Eye Contact: Good Motor Activity: Freedom of movement Speech: Logical/coherent Level of Consciousness: Alert Mood: Pleasant, Euthymic Affect: Appropriate to circumstance Anxiety Level: Minimal Thought Processes: Coherent, Relevant Judgement: Unimpaired Orientation: Person, Place, Time, Situation Obsessive Compulsive Thoughts/Behaviors: Minimal  Cognitive Functioning Concentration: Fair Memory: Recent Intact, Remote Intact IQ: Average Insight: Fair Impulse Control: Poor Appetite: Good Weight Loss: 0 Weight Gain: 0 Sleep: Decreased Total Hours of Sleep: 5 Vegetative Symptoms: Staying in bed  ADLScreening [facility]) Patient's cognitive ability adequate to safely complete daily activities?: Yes Patient able to express need for assistance with ADLs?: Yes Independently performs ADLs?: Yes (appropriate for developmental age)  Prior Inpatient Therapy Prior Inpatient Therapy: No  Prior Outpatient Therapy Prior Outpatient Therapy: No Does patient have an ACCT team?: No Does patient have Intensive In-House Services?  : No Does patient have [facility] services? : No Does patient have P4CC services?: No  ADL Screening (condition at time of admission) Patient's cognitive ability adequate to safely  complete daily activities?: Yes Is the patient deaf or have difficulty hearing?: No Does the patient have difficulty seeing, even when wearing glasses/contacts?: No Does the patient have difficulty concentrating, remembering, or making decisions?: No Patient able to express need for assistance with ADLs?: Yes Does the patient have difficulty dressing or bathing?: No Independently performs ADLs?: Yes (appropriate for developmental age)       Abuse/Neglect Assessment (Assessment to be complete while patient is alone) Physical Abuse: Denies Verbal Abuse: Denies Sexual Abuse: Denies Exploitation of patient/patient's resources: Denies Self-Neglect: Denies     Merchant navy officerAdvance Directives (For Healthcare) Does patient have an advance directive?: No Would patient like information on creating an advanced directive?: No - patient declined information    Additional Information 1:1 In Past 12 Months?: Yes CIRT Risk: No Elopement Risk: No Does patient have medical clearance?: Yes     Disposition:  Disposition Initial Assessment Completed for this Encounter: Yes Disposition of Patient: Other dispositions Other disposition(s): Other (Comment) (AM Psych eval )  Garry Bochicchio S 01/20/2016 12:59 AM

## 2016-01-20 NOTE — Consult Note (Signed)
[facility] Face-to-Face Psychiatry Consult   Reason for Consult: anger outburst, mood swings, depression, psychosis Referring Physician: EDP Patient Identification: Michael Daugherty MRN:  [id number] Principal Diagnosis: Major depressive disorder, single episode, severe with psychotic features [facility]) Diagnosis:   Patient Active Problem List   Diagnosis Date Noted  . Intermittent explosive disorder [F63.81] 01/20/2016    Priority: High  . Major depressive disorder, single episode, severe with psychotic features ([place]) [F32.3] 01/20/2016    Priority: High  . Dental abscess [K04.7] 10/19/2015  . Constipation [K59.00] 10/19/2015  . Environmental allergies [Z91.048] 08/24/2014  . Excess ear wax [H61.20] 08/24/2014  . Dyspepsia [R10.13] 08/24/2014  . Nasal congestion [R09.81] 08/24/2014  . HLD (hyperlipidemia) [E78.5] 08/24/2014    Total Time spent with patient: 45 minutes  Subjective:   Michael Daugherty is a 40 y.o. male patient admitted due to making threatening remarks towards his mother.  HPI:  Michael Daugherty is an 40 y.o. Male who denies any prior history of mental illness, however he reports history of Cannabis, Alcohol abuse and childhood Seizure disorder. He was brought to  [facility] after being petitioned for involuntary commitment by his mother. Pt reports  "I was active like a devil, got into a disagreement with my moms and pulled a rug under her legs, thank God she did not fall and broke her head". "I am 45 and she is 29, she has been nice to me, Have been living with her for the past 3 years when I moved her from [place] after I got out of jail.'' Pt denies SI and HI at this time but admitted that he has anger issues and has occasional  auditory and visual hallucinations. Pt reported that sometimes the voices will tell him that he is better off dead and to kill himself. He states that he is dealing with multiple stressors such as financial issues, unemployment and not been able to be  independent. Pt is endorsing multiple depressive symptoms ongoing for years, he reports feeling hopeless, helpless, irritable but denies suicidal or homicidal ideations, intent or plan.   Past Psychiatric History: denies  Risk to Self: Suicidal Ideation: No Suicidal Intent: No Is patient at risk for suicide?: No Suicidal Plan?: No Access to Means: No What has been your use of drugs/alcohol within the last 12 months?: Alcohol and THC use reported.  How many times?: 0 Other Self Harm Risks: Pt denies Triggers for Past Attempts: None known Intentional Self Injurious Behavior: None Risk to Others: Homicidal Ideation: No Thoughts of Harm to Others: No Current Homicidal Intent: No Current Homicidal Plan: No Access to Homicidal Means: No Identified Victim: N/A History of harm to others?: No Assessment of Violence: None Noted Violent Behavior Description: No violent behaviors observed.  Does patient have access to weapons?: No Criminal Charges Pending?: No Does patient have a court date: No Prior Inpatient Therapy: Prior Inpatient Therapy: No Prior Outpatient Therapy: Prior Outpatient Therapy: No Does patient have an ACCT team?: No Does patient have Intensive In-House Services?  : No Does patient have [facility] services? : No Does patient have P4[facility] services?: No  Past Medical History:  Past Medical History  Diagnosis Date  . Attention deficit disorder (ADD)   . Seasonal allergies     .Seizure disorder  Past Surgical History  Procedure Laterality Date  . Rhinoplasty     Family History:  Family History  Problem Relation Age of Onset  . Diabetes Mother   . Cancer Father   . Diabetes Brother   .  Stomach cancer Mother    Family Psychiatric  History: denies Social History:  History  Alcohol Use No     History  Drug Use No    Social History   Social History  . Marital Status: Single    Spouse Name: N/A  . Number of Children: N/A  . Years of Education: N/A   Social  History Main Topics  . Smoking status: Former Research scientist (life sciences)  . Smokeless tobacco: None  . Alcohol Use: No  . Drug Use: No  . Sexual Activity: Not Asked   Other Topics Concern  . None   Social History Narrative   Additional Social History:    Allergies:   Allergies  Allergen Reactions  . Other Hives    Pecan nuts  . Oysters [Shellfish Allergy] Hives  . Soybean-Containing Drug Products Hives  . Strawberry Extract Hives    Labs:  Results for orders placed or performed during the hospital encounter of 01/19/16 (from the past 48 hour(s))  Comprehensive metabolic panel     Status: Abnormal   Collection Time: 01/20/16 12:04 AM  Result Value Ref Range   Sodium 143 135 - 145 mmol/L   Potassium 3.9 3.5 - 5.1 mmol/L   Chloride 106 101 - 111 mmol/L   CO2 28 22 - 32 mmol/L   Glucose, Bld 101 (H) 65 - 99 mg/dL   BUN 16 6 - 20 mg/dL   Creatinine, Ser 1.15 0.61 - 1.24 mg/dL   Calcium 9.1 8.9 - 10.3 mg/dL   Total Protein 7.7 6.5 - 8.1 g/dL   Albumin 4.4 3.5 - 5.0 g/dL   AST 19 15 - 41 U/L   ALT 18 17 - 63 U/L   Alkaline Phosphatase 49 38 - 126 U/L   Total Bilirubin 0.7 0.3 - 1.2 mg/dL   GFR calc non Af Amer >60 >60 mL/min   GFR calc Af Amer >60 >60 mL/min    Comment: (NOTE) The eGFR has been calculated using the CKD EPI equation. This calculation has not been validated in all clinical situations. eGFR's persistently <60 mL/min signify possible Chronic Kidney Disease.    Anion gap 9 5 - 15  Ethanol (ETOH)     Status: None   Collection Time: 01/20/16 12:04 AM  Result Value Ref Range   Alcohol, Ethyl (B) <5 <5 mg/dL    Comment:        LOWEST DETECTABLE LIMIT FOR SERUM ALCOHOL IS 5 mg/dL FOR MEDICAL PURPOSES ONLY   CBC     Status: Abnormal   Collection Time: 01/20/16 12:04 AM  Result Value Ref Range   WBC 6.8 4.0 - 10.5 K/uL   RBC 6.06 (H) 4.22 - 5.81 MIL/uL   Hemoglobin 14.2 13.0 - 17.0 g/dL   HCT 43.5 39.0 - 52.0 %   MCV 71.8 (L) 78.0 - 100.0 fL   MCH 23.4 (L) 26.0 - 34.0  pg   MCHC 32.6 30.0 - 36.0 g/dL   RDW 15.1 11.5 - 15.5 %   Platelets 163 150 - 400 K/uL  Urine rapid drug screen (hosp performed) (Not at [facility])     Status: None   Collection Time: 01/20/16  1:33 AM  Result Value Ref Range   Opiates NONE DETECTED NONE DETECTED   Cocaine NONE DETECTED NONE DETECTED   Benzodiazepines NONE DETECTED NONE DETECTED   Amphetamines NONE DETECTED NONE DETECTED   Tetrahydrocannabinol NONE DETECTED NONE DETECTED   Barbiturates NONE DETECTED NONE DETECTED    Comment:  DRUG SCREEN FOR MEDICAL PURPOSES ONLY.  IF CONFIRMATION IS NEEDED FOR ANY PURPOSE, NOTIFY LAB WITHIN 5 DAYS.        LOWEST DETECTABLE LIMITS FOR URINE DRUG SCREEN Drug Class       Cutoff (ng/mL) Amphetamine      1000 Barbiturate      200 Benzodiazepine   924 Tricyclics       268 Opiates          300 Cocaine          300 THC              50     Current Facility-Administered Medications  Medication Dose Route Frequency Provider Last Rate Last Dose  . haloperidol (HALDOL) tablet 0.5 mg  0.5 mg Oral BID Mehgan Santmyer, MD      . traZODone (DESYREL) tablet 100 mg  100 mg Oral QHS Corena Pilgrim, MD       Current Outpatient Prescriptions  Medication Sig Dispense Refill  . pantoprazole (PROTONIX) 40 MG tablet Take 1 tablet (40 mg total) by mouth daily. 90 tablet 3  . polyethylene glycol powder (GLYCOLAX/MIRALAX) powder Take 17 g by mouth daily. (Patient taking differently: Take 17 g by mouth daily as needed for mild constipation or moderate constipation. ) 500 g 3  . carbamide peroxide (DEBROX) 6.5 % otic solution Place 5 drops into both ears 2 (two) times daily. For 5 days (Patient not taking: Reported on 01/19/2016) 15 mL 0  . cetirizine (ZYRTEC) 10 MG tablet Take 1 tablet (10 mg total) by mouth daily. (Patient not taking: Reported on 01/19/2016) 30 tablet 5  . traMADol (ULTRAM) 50 MG tablet Take 1 tablet (50 mg total) by mouth every 12 (twelve) hours as needed. (Patient not taking:  Reported on 01/19/2016) 30 tablet 0    Musculoskeletal: Strength & Muscle Tone: within normal limits Gait & Station: normal Patient leans: N/A  Psychiatric Specialty Exam: Review of Systems  Constitutional: Negative.   HENT: Negative.   Eyes: Negative.   Respiratory: Negative.   Cardiovascular: Negative.   Gastrointestinal: Negative.   Genitourinary: Negative.   Musculoskeletal: Negative.   Skin: Negative.   Neurological: Negative.   Endo/Heme/Allergies: Negative.   Psychiatric/Behavioral: Positive for depression and hallucinations. The patient is nervous/anxious.     Blood pressure 118/66, pulse 57, temperature 98.2 F (36.8 C), temperature source Oral, resp. rate 18, SpO2 99 %.There is no weight on file to calculate BMI.  General Appearance: Casual  Eye Contact::  Good  Speech:  Clear and Coherent  Volume:  Normal  Mood:  Anxious and Dysphoric  Affect:  Appropriate  Thought Process:  Goal Directed  Orientation:  Full (Time, Place, and Person)  Thought Content:  Hallucinations: Auditory  Suicidal Thoughts:  No  Homicidal Thoughts:  No  Memory:  Immediate;   Good Recent;   Good Remote;   Good  Judgement:  Impaired  Insight:  Lacking  Psychomotor Activity:  Increased  Concentration:  Good  Recall:  Good  Fund of Knowledge:Good  Language: Good  Akathisia:  No  Handed:  Right  AIMS (if indicated):     Assets:  Communication Skills Desire for Improvement Social Support  ADL's:  Intact  Cognition: WNL  Sleep:   poor   Treatment Plan Summary: Daily contact with patient to assess and evaluate symptoms and progress in treatment and Medication management  -Start Haldol 0.90m bid psychosis/mood -Start Trazodone 1043mqhs for depression/insomnia  Disposition: Recommend psychiatric Inpatient admission  when medically cleared. Supportive therapy provided about ongoing stressors.  Corena Pilgrim, MD 01/20/2016 1:46 PM

## 2016-01-20 NOTE — ED Notes (Signed)
[facility] called for transport to [facility] 

## 2016-01-20 NOTE — ED Notes (Signed)
Pt states to this nurse that he has realized that "something is not right" and he "needs help." Pt does deny thoughts of harming others. He states "anything I do is aimed me," when asked about intent to harm others.

## 2016-01-20 NOTE — ED Notes (Signed)
Bed: WA30 Expected date:  Expected time:  Means of arrival:  Comments: 

## 2016-01-20 NOTE — ED Notes (Signed)
Pt transferred to [facility] via [facility]. 

## 2016-01-20 NOTE — ED Notes (Signed)
Pt given ginger ale.

## 2016-01-20 NOTE — Progress Notes (Signed)
Admission Note:  D-40 y.o. male who presents IVC in no acute distress for the treatment of psychosis and aggression.  Patient was IVC'd by his mother following a disagreement between patient and mother.  Patient reports, during the verbal disagreement with his mother, he "pulled the rug from under moms feet".  Patient reports issues with aggression and verbalizes that he has a "negative attitude".  Patient verbalizes remorse for incident with his mother and verbalizes motivation for help with aggression.  Patient appears anxious and was cooperative with admission process. Patient currently denies SI.  Patient reports AVH and states "I hear voices telling me that I am nothing, never going to amount to nothing and that I'm worthless".  Patient reports that he visualizes himself "jumping off of a bridge.  Patient contracts for safety upon admission.  Past medical Hx of GI issues and is being followed by a Manufacturing engineerGastrologist.  Patient reports that he has "constipated gas" and he had "the runs" today in [facility].  Patient reports Imodium given in [facility] offered relief. Patient reports smoking "3 cigarettes a week" and marijuana use daily.  Patient identifies stressors as "family issues" and "not being abe to financially support myself. No job".   A- Skin was assessed and found to be clear of any abnormal marks apart from a scars on arms bilaterally patient reports is due to "eczema and scratching".  Patient searched and no contraband found, POC and unit policies explained and understanding verbalized. Consents obtained. Food and fluids offered and accepted.  R- Patient had no additional questions or concerns.

## 2016-01-20 NOTE — Tx Team (Signed)
Initial Interdisciplinary Treatment Plan   PATIENT STRESSORS: Financial difficulties Marital or family conflict Occupational concerns   PATIENT STRENGTHS: Motivation for treatment/growth Religious Affiliation Supportive family/friends   PROBLEM LIST: Problem List/Patient Goals Date to be addressed Date deferred Reason deferred Estimated date of resolution  Psychosis 01/20/2016  01/20/2016   D/C  Aggression 01/20/2016  01/20/2016   D/C  "Attitude and Aggression" 01/20/2016  01/20/2016   D/C  "Being disrespectful to elderly" 01/20/2016  01/20/2016   D/C                                 DISCHARGE CRITERIA:  Ability to meet basic life and health needs Adequate post-discharge living arrangements Improved stabilization in mood, thinking, and/or behavior Medical problems require only outpatient monitoring Motivation to continue treatment in a less acute level of care Need for constant or close observation no longer present Safe-care adequate arrangements made  PRELIMINARY DISCHARGE PLAN: Outpatient therapy Return to previous living arrangement  PATIENT/FAMIILY INVOLVEMENT: This treatment plan has been presented to and reviewed with the patient, Michael Daugherty.  The patient and family have been given the opportunity to ask questions and make suggestions.  Larry SierrasMiddleton, Keegen Heffern P 01/20/2016, 9:49 PM

## 2016-01-21 DIAGNOSIS — F6381 Intermittent explosive disorder: Secondary | ICD-10-CM

## 2016-01-21 DIAGNOSIS — F333 Major depressive disorder, recurrent, severe with psychotic symptoms: Secondary | ICD-10-CM

## 2016-01-21 LAB — LIPID PANEL
CHOLESTEROL: 147 mg/dL (ref 0–200)
HDL: 34 mg/dL — AB (ref >40)
LDL Cholesterol: 65 mg/dL (ref 0–99)
TRIGLYCERIDES: 240 mg/dL — AB (ref <150)
Total CHOL/HDL Ratio: 4.3 RATIO
VLDL: 48 mg/dL — ABNORMAL HIGH (ref 0–40)

## 2016-01-21 MED ORDER — TRAZODONE HCL 50 MG PO TABS
50.0000 mg | ORAL_TABLET | Freq: Every evening | ORAL | Status: DC | PRN
Start: 2016-01-21 — End: 2016-01-25
  Administered 2016-01-22 – 2016-01-24 (×2): 50 mg via ORAL
  Filled 2016-01-21: qty 7
  Filled 2016-01-21 (×2): qty 1

## 2016-01-21 MED ORDER — HALOPERIDOL 2 MG PO TABS
2.0000 mg | ORAL_TABLET | Freq: Every day | ORAL | Status: DC
  Administered 2016-01-22 – 2016-01-24 (×3): 2 mg via ORAL
  Filled 2016-01-21 (×3): qty 1
  Filled 2016-01-21: qty 7
  Filled 2016-01-21: qty 1

## 2016-01-21 MED ORDER — SALINE SPRAY 0.65 % NA SOLN
1.0000 | NASAL | Status: DC | PRN
  Filled 2016-01-21: qty 44

## 2016-01-21 MED ORDER — LORATADINE 10 MG PO TABS
10.0000 mg | ORAL_TABLET | Freq: Every day | ORAL | Status: DC
  Administered 2016-01-21 – 2016-01-25 (×5): 10 mg via ORAL
  Filled 2016-01-21 (×7): qty 1

## 2016-01-21 MED ORDER — DIVALPROEX SODIUM ER 500 MG PO TB24
500.0000 mg | ORAL_TABLET | Freq: Every day | ORAL | Status: DC
Start: 2016-01-21 — End: 2016-01-25
  Administered 2016-01-21 – 2016-01-24 (×4): 500 mg via ORAL
  Filled 2016-01-21 (×5): qty 1
  Filled 2016-01-21: qty 7
  Filled 2016-01-21: qty 1

## 2016-01-21 MED ORDER — PANTOPRAZOLE SODIUM 40 MG PO TBEC
40.0000 mg | DELAYED_RELEASE_TABLET | Freq: Every day | ORAL | Status: DC
  Administered 2016-01-21 – 2016-01-25 (×5): 40 mg via ORAL
  Filled 2016-01-21 (×7): qty 1

## 2016-01-21 NOTE — Progress Notes (Signed)
DAR NOTE: Patient appears fidgety and anxious.  Denies pain, auditory and visual hallucinations.  Rates depression at 10, hopelessness at 10, and anxiety at 10.  Reports withdrawal symptoms of tremors, diarrhea, agitation, runny nose, chilling, cramping, nausea, and irritability on self inventory form.  Maintained on routine safety checks.  Medications given as prescribed.  Support and encouragement offered as needed.  Attended group and participated.  States goal for today is "my attitude, anger issue and talking back."  Patient observed socializing with peers in the dayroom.  Patient laughing out inappropriately.  Offered no complaint.
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
## 2016-01-21 NOTE — Progress Notes (Signed)
D   Pt can be somewhat intrusive and preoccupied   He talked about his medications and received information about meds he would get tonight  Then came and asked if he had medications to take like he didn't remember our earlier conversation    Pt is pleasant on approach and cooperative A    Verbal support given   Medications administered and effectiveness monitored    Q 15 min checks R   Pt safe at present
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
## 2016-01-21 NOTE — [facility] Group Notes (Signed)
[facility] Group Notes:  (Clinical Social Work)  01/21/2016  11:15-12:00PM  Summary of Progress/Problems:   Today's process group involved patients discussing their feelings related to being hospitalized, as well as how they can use their present feelings to create a plan for avoiding future hospitalizations. The patient expressed his primary feeling about being hospitalized is depressed and mad; however, he also showed a great deal of willingness to receive help.  He was very pleasant and laughed a lot.  He smiled widely when talking about being mad and depressed.  He talked at length about wanting a job, and was excited to learn that [facility] plans [facility] for his follow-up also has a Supported Furniture conservator/restorermployment program.  He wants to try either that or Vocational Rehabilitation, states he has already been in touch with V.R.  Type of Therapy:  Group Therapy - Process  Participation Level:  Active  Participation Quality:  Attentive, Sharing and Supportive  Affect:  Not Congruent  Cognitive:  Appropriate  Insight:  Developing/Improving  Engagement in Therapy:  Engaged  Modes of Intervention:  Exploration, Discussion  Ambrose MantleMareida Grossman-Orr, LCSW 01/21/2016, 1:33 PM
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
## 2016-01-21 NOTE — BHH Counselor (Signed)
Adult Comprehensive Assessment  Patient ID: Shakeel Drzewiecki, male   DOB: [birth year], 40 Y.[place]   MRN: [id number]  Information Source: Information source: Patient  Current Stressors:  Educational / Learning stressors: NA Employment / Job issues: Unemployed Family Relationships: Some strain with mother and aunt; patient takes Horticulturist, commercial / Lack of resources (include bankruptcy): No income Housing / Lack of housing: Lives with mother; would like to live on his own Physical health (include injuries & life threatening diseases): NA Social relationships: "casual friends only" Substance abuse: Pt doesn't feel his use is an issue Bereavement / Loss: Patient reports multiple losses ye all were in the past (2000-2010)  Living/Environment/Situation:  Living Arrangements: Parent Living conditions (as described by patient or guardian): Patient lives with his mother  How long has patient lived in current situation?: 3 years since coming from [place] after 3 year incarceration What is atmosphere in current home: Comfortable, Supportive, Other (Comment) (yet at times strained)  Family History:  Marital status: Single Does patient have children?: No  Childhood History:  By whom was/is the patient raised?: Mother Additional childhood history information: Father was never around as he reportedly smoked and drank; father ultimately went to [facility] and had another family there Description of patient's relationship with caregiver when they were a child: Good with mother yet pt reports "she was always working (3 jobs) and my sister basically raised me" Patient's description of current relationship with people who raised him/her: "Good with mother except when my anger gets the best of me" How were you disciplined when you got in trouble as a child/adolescent?: "My sister was so strict I couldn't get into trouble" Does patient have siblings?: Yes Number of Siblings: 3 Description of patient's current  relationship with siblings: "not so great" Did patient suffer any verbal/emotional/physical/sexual abuse as a child?: No Did patient suffer from severe childhood neglect?: No Has patient ever been sexually abused/assaulted/raped as an adolescent or adult?: No Was the patient ever a victim of a crime or a disaster?: No Witnessed domestic violence?: No Has patient been effected by domestic violence as an adult?: No  Education:  Highest grade of school patient has completed: 14 Currently a Consulting civil engineer?: No Learning disability?: No  Employment/Work Situation:   Employment situation: Unemployed Patient's job has been impacted by current illness: Yes Describe how patient's job has been impacted: Pt reports difficulty finding a job What is the longest time patient has a held a job?: 2 years Where was the patient employed at that time?: [employer] in [place] Has patient ever been in the [employer]?: No Has patient ever served in combat?: No Did You Receive Any Psychiatric Treatment/Services While in Equities trader?: No Are There Guns or Other Weapons in Your Home?: No  Financial Resources:   Surveyor, quantity resources: Support from parents / caregiver Does patient have a Lawyer or guardian?: No  Alcohol/Substance Abuse:   What has been your use of drugs/alcohol within the last 12 months?: Alcohol and THC using 3 times weekly; pt reports he shares 1-2 40 oz beers and 1-2 joints 3 times weekly with one or tow people Alcohol/Substance Abuse Treatment Hx: Denies past history Has alcohol/substance abuse ever caused legal problems?: No  Social Support System:   Forensic psychologist System: Fair Museum/gallery exhibitions officer System: Mostly family; only casual friends Type of faith/religion: Ephriam Knuckles How does patient's faith help to cope with current illness?: "Church helps but I haven't been in awhile because I was sick"  Leisure/Recreation:   Leisure  and Hobbies: Sing, walk,  exercise  Strengths/Needs:   What things does the patient do well?: "I stay busy and outta mom's way" In what areas does patient struggle / problems for patient: "Lack if income due to difficulty finding job"  Discharge Plan:   Does patient have access to transportation?: No Plan for no access to transportation at discharge: Family or bus Will patient be returning to same living situation after discharge?: Yes Currently receiving community mental health services: No If no, would patient like referral for services when discharged?: Yes (What county?) Medical sales representative) Does patient have financial barriers related to discharge medications?: Yes Patient description of barriers related to discharge medications: Pt reports he has orange card yet may not be able to afford co pay  Summary/Recommendations:   Summary and Recommendations (to be completed by the evaluator): Patient is a 40 YO single unemployed African American male admitted IVC to [facility] and reports primary trigger for admission was family's concern. Patient will benefit from crisis stabilization, medication evaluation, group therapy and psycho education, in addition to case management for discharge planning. At discharge it is recommended that patient adhere to the established discharge plan and continue in treatment.   Clide Dales. 01/21/2016

## 2016-01-21 NOTE — BHH Group Notes (Signed)
BHH Group Notes:  (Nursing/MHT/Case Management/Adjunct)  Date:  01/21/2016  Time:  1:34 PM  Type of Therapy:  Nurse Education  Participation Level:  Active  Participation Quality:  Appropriate and Attentive  Affect:  Appropriate  Cognitive:  Alert  Insight:  Appropriate and Good  Engagement in Group:  Engaged  Modes of Intervention:  Activity, Discussion and Education  Summary of Progress/Problems: Topic was on healthy coping skills. Discussed the importance of learning and maintaining new coping skills that leads to a healthy lifestyle.  Patient was attentive and receptive.       Mickie Baillizabeth O Iwenekha 01/21/2016, 1:34 PM

## 2016-01-22 NOTE — BHH Group Notes (Signed)
BHH Group Notes:  (Clinical Social Work)  01/22/2016  11:00AM-12:00PM  Summary of Progress/Problems:  The main focus of today's process group was to listen to a variety of genres of music and to identify that different types of music provoke different responses.  The patient then was able to identify personally what was soothing for them, as well as energizing, as well as how patient can personally use this knowledge in sleep habits, with depression, and with other symptoms.  The patient expressed appreciation and excitement throughout group of all the music.  At the end of group he stated how good he felt, thanked CSW twice.  Type of Therapy:  Music Therapy   Participation Level:  Active  Participation Quality:  Attentive and Sharing  Affect:  Appropriate  Cognitive:  Oriented  Insight:  Engaged  Engagement in Therapy:  Engaged  Modes of Intervention:   Activity, Exploration  Michael MantleMareida Grossman-Orr, LCSW 01/22/2016

## 2016-01-22 NOTE — Progress Notes (Signed)
DAR NOTE: Pt present with flat affect and depressed mood in the unit. Pt has been observed walking in the hallway and in the day room interacting with peers.Pt denies physical pain, took all his meds as scheduled. As per self inventory, pt had a good night sleep, good appetite, normal energy, and good concentration. Pt rate depression at 0, hopeless ness at 0, and anxiety at a 0. Pt's goal for to day is " the important thing that I want to learn from this clinic is to get better and do better." Pt's safety ensured with 15 minute and environmental checks. Pt currently denies SI/HI and A/V hallucinations. Pt verbally agrees to seek staff if SI/HI or A/VH occurs and to consult with staff before acting on these thoughts. Will continue POC.

## 2016-01-22 NOTE — BHH Counselor (Signed)
On Saturday, 01/21/16 during PSA patient indicated to CSW he had talked to his mother and aunt and would be able to return to mother's home upon discharge. Today during Suicide Prevention Education with mother,  Cori RazorJosephine Filsaime at [phone] Ms Zonia KiefStephens stated she cannot allow patient to return to her home.  Ms Zonia KiefStephens shared multiple ways in which patient has damaged her property over the last two years (cutting window blinds with scissors, stopped up toilets with household cleaning brushes, broken pip[es in her basement with a hammer and most recently broken her Nu[employer] oven). Patient has also cursed and yelled at his mother for over one year. This week when he physically pulled the rug out from under her feet while she was cooking his dinner has caused her to be fearful of her safety.  Ms Zonia KiefStephens states patient will need to find housing at shelter or with a friend verses at her home or homes of other family members. Mrs Zonia KiefStephens states patient may come to her home to get some clothing and to return her key if he will come with police escort and call ahead as to date and approximate time.   Carney Bernatherine C Harrill, LCSW

## 2016-01-22 NOTE — Progress Notes (Signed)
[facility]  01/22/2016 3:18 PM Michael Daugherty Albino  MRN:  [id number] Subjective:  Patient reports " I am feeling better today, I've made friends here."  Objective: Michael Daugherty Burnsworth is awake, alert and oriented X3 , found resting in dayroom interacting with peers.  Denies suicidal or homicidal ideation. Denies auditory or visual hallucination and does not appear to be responding to internal stimuli. States " I've heard the voice tell me anything at all today." Patient  Reports interacting well with staff and others. Patient reports he is medication compliant without mediation side effects. Patient reports he is tolerating his medications well. Patient denies depression or depression symptoms. Reports good appetite and states his is resting well. Support, encouragement and reassurance was provided.   Principal Problem: MDD (major depressive disorder), recurrent episode, severe (HCC) Diagnosis:   Patient Active Problem List   Diagnosis Date Noted  . Intermittent explosive disorder [F63.81] 01/20/2016  . Major depressive disorder, single episode, severe with psychotic features (HCC) [F32.3] 01/20/2016  . MDD (major depressive disorder), recurrent episode, severe (HCC) [F33.2] 01/20/2016  . Dental abscess [K04.7] 10/19/2015  . Constipation [K59.00] 10/19/2015  . Environmental allergies [Z91.048] 08/24/2014  . Excess ear wax [H61.20] 08/24/2014  . Dyspepsia [R10.13] 08/24/2014  . Nasal congestion [R09.81] 08/24/2014  . HLD (hyperlipidemia) [E78.5] 08/24/2014   Total Time spent with patient: 30 minutes  Past Psychiatric History: See Above  Past Medical History:  Past Medical History  Diagnosis Date  . Attention deficit disorder (ADD)   . Seasonal allergies     Past Surgical History  Procedure Laterality Date  . Rhinoplasty     Family History:  Family History  Problem Relation Age of Onset  . Diabetes Mother   . Cancer Father   . Diabetes Brother   . Stomach cancer Mother     Family Psychiatric  History: See H&P Social History:  History  Alcohol Use No     History  Drug Use No    Social History   Social History  . Marital Status: Single    Spouse Name: N/A  . Number of Children: N/A  . Years of Education: N/A   Social History Main Topics  . Smoking status: Former Games developermoker  . Smokeless tobacco: None  . Alcohol Use: No  . Drug Use: No  . Sexual Activity: Not Asked   Other Topics Concern  . None   Social History Narrative   Additional Social History:                         Sleep: Good  Appetite:  Good  Current Medications: Current Facility-Administered Medications  Medication Dose Route Frequency Provider Last Rate Last Dose  . acetaminophen (TYLENOL) tablet 650 mg  650 mg Oral Q6H PRN Earney NavyJosephine C Onuoha, NP      . alum & mag hydroxide-simeth (MAALOX/MYLANTA) 200-200-20 MG/5ML suspension 30 mL  30 mL Oral Q4H PRN Earney NavyJosephine C Onuoha, NP   30 mL at 01/21/16 0504  . divalproex (DEPAKOTE ER) 24 hr tablet 500 mg  500 mg Oral QHS Rockey SituFernando A Tajae Rybicki, MD   500 mg at 01/21/16 2056  . haloperidol (HALDOL) tablet 2 mg  2 mg Oral QHS Rockey SituFernando A Chamika Cunanan, MD      . loratadine (CLARITIN) tablet 10 mg  10 mg Oral Daily Kristeen MansFran E Hobson, NP   10 mg at 01/22/16 0811  . magnesium hydroxide (MILK OF MAGNESIA) suspension 30 mL  30 mL Oral  Daily PRN Earney Navy, NP      . pantoprazole (PROTONIX) EC tablet 40 mg  40 mg Oral Daily Kristeen Mans, NP   40 mg at 01/22/16 0811  . sodium chloride (OCEAN) 0.65 % nasal spray 1 spray  1 spray Each Nare PRN Kristeen Mans, NP      . traZODone (DESYREL) tablet 50 mg  50 mg Oral QHS PRN Craige Cotta, MD        Lab Results:  Results for orders placed or performed during the hospital encounter of 01/20/16 (from the past 48 hour(s))  Lipid panel     Status: Abnormal   Collection Time: 01/21/16  6:20 PM  Result Value Ref Range   Cholesterol 147 0 - 200 mg/dL   Triglycerides 161 (H) <150 mg/dL   HDL 34 (L)  >09 mg/dL   Total CHOL/HDL Ratio 4.3 RATIO   VLDL 48 (H) 0 - 40 mg/dL   LDL Cholesterol 65 0 - 99 mg/dL    Comment:        Total Cholesterol/HDL:CHD Risk Coronary Heart Disease Risk Table                     Men   Women  1/2 Average Risk   3.4   3.3  Average Risk       5.0   4.4  2 X Average Risk   9.6   7.1  3 X Average Risk  23.4   11.0        Use the calculated Patient Ratio above and the CHD Risk Table to determine the patient's CHD Risk.        ATP III CLASSIFICATION (LDL):  <100     mg/dL   Optimal  604-540  mg/dL   Near or Above                    Optimal  130-159  mg/dL   Borderline  981-191  mg/dL   High  >478     mg/dL   Very High Performed at [facility]     Blood Alcohol level:  Lab Results  Component Value Date   [facility] <5 01/20/2016    Physical Findings: AIMS: Facial and Oral Movements Muscles of Facial Expression: None, normal Lips and Perioral Area: None, normal Jaw: None, normal Tongue: None, normal,Extremity Movements Upper (arms, wrists, hands, fingers): None, normal Lower (legs, knees, ankles, toes): None, normal, Trunk Movements Neck, shoulders, hips: None, normal, Overall Severity Severity of abnormal movements (highest score from questions above): None, normal Incapacitation due to abnormal movements: None, normal Patient's awareness of abnormal movements (rate only patient's report): No Awareness, Dental Status Current problems with teeth and/or dentures?: No Does patient usually wear dentures?: No  CIWA:    COWS:     Musculoskeletal: Strength & Muscle Tone: within normal limits Gait & Station: normal Patient leans: N/A  Psychiatric Specialty Exam: Review of Systems  Gastrointestinal: Positive for abdominal pain and constipation.  Psychiatric/Behavioral: Negative for depression, suicidal ideas and hallucinations. The patient is not nervous/anxious.   All other systems reviewed and are negative.   Blood pressure 129/89, pulse  82, temperature 98 F (36.7 C), temperature source Oral, resp. rate 18, height  (1.702 m), weight 83.462 kg (184 lb).Body mass index is 28.81 kg/(m^2).  General Appearance: Casual  Eye Contact::  Good  Speech:  Clear and Coherent  Volume:  Normal  Mood:  Anxious  Affect:  Congruent  Thought Process:  Coherent  Orientation:  Full (Time, Place, and Person)  Thought Content:  Hallucinations: Auditory  Suicidal Thoughts:  No  Homicidal Thoughts:  No denies trying to hurt or kill is mother  Memory:  Immediate;   Fair Recent;   Fair Remote;   Fair  Judgement:  Fair  Insight:  Lacking  Psychomotor Activity:  Normal  Concentration:  Fair  Recall:  [employer] of Knowledge:Fair  Language: Fair  Akathisia:  No  Handed:  Right  AIMS (if indicated):     Assets:  Desire for Improvement Social Support  ADL's:  Intact  Cognition: WNL  Sleep:  Number of Hours: 5    I agree with current treatment plan on 01/22/2016, Patient seen face-to-face for psychiatric evaluation follow-up, chart reviewed. Reviewed the information documented and agree with the treatment plan.  Treatment Plan Summary: Daily contact with patient to assess and evaluate symptoms and progress in treatment and Medication management   Continue Depakote 500 mg PO QHS for mood Continue Haldol 0.5mg  PO BID mood stabilization. Continue with Trazodone 100 mg for insomnia Will continue to monitor vitals ,medication compliance and treatment side effects while patient is here.  Reviewed labs Glucose 101 elevated ,BAL-0 Labs ordered: EKG, Prolactin, Lipid Panel, A1c  CSW will start working on disposition.  Patient to participate in therapeutic milieu  Oneta Rack, NP 01/22/2016, 3:18 PM Agree with NP note as above  Nehemiah Massed, MD

## 2016-01-22 NOTE — [facility] Suicide Risk Assessment (Signed)
[facility] INPATIENT:  Family/Significant Other Suicide Prevention Education  Suicide Prevention Education:  Family/Significant Other Refusal to Support Patient after Discharge:  Patient has identified home of family/significant other as the place the patient will be residing after discharge. Yet mother states he cannot return there.   With written consent of the patient, CSW provided Suicide Prevention Education to Exxon Mobil CorporationJosephine Daugherty at [phone]. Conversation with pt's mother included risk factors, warning signs and resources to contact for help. Mobile crisis services explained and information as to where pt will likely have follow up [facility]).   Mrs Zonia KiefStephens indicates she will not be responsible for the patient after discharge. She indicates patient will need to find housing at Shelter or with a friend verses other family members.  Clide DalesHarrill, Catherine Campbell 01/22/2016,1:12 PM

## 2016-01-23 ENCOUNTER — Encounter (HOSPITAL_COMMUNITY): Payer: Self-pay | Admitting: Psychiatry

## 2016-01-23 DIAGNOSIS — E785 Hyperlipidemia, unspecified: Secondary | ICD-10-CM | POA: Clinically undetermined

## 2016-01-23 DIAGNOSIS — Z8669 Personal history of other diseases of the nervous system and sense organs: Secondary | ICD-10-CM

## 2016-01-23 DIAGNOSIS — F1220 Cannabis dependence, uncomplicated: Secondary | ICD-10-CM | POA: Clinically undetermined

## 2016-01-23 DIAGNOSIS — F1020 Alcohol dependence, uncomplicated: Secondary | ICD-10-CM | POA: Clinically undetermined

## 2016-01-23 DIAGNOSIS — F19929 Other psychoactive substance use, unspecified with intoxication, unspecified: Secondary | ICD-10-CM | POA: Clinically undetermined

## 2016-01-23 DIAGNOSIS — F19980 Other psychoactive substance use, unspecified with psychoactive substance-induced anxiety disorder: Secondary | ICD-10-CM

## 2016-01-23 DIAGNOSIS — F1010 Alcohol abuse, uncomplicated: Secondary | ICD-10-CM | POA: Clinically undetermined

## 2016-01-23 LAB — PROLACTIN: PROLACTIN: 15.3 ng/mL — AB (ref 4.0–15.2)

## 2016-01-23 LAB — HEMOGLOBIN A1C
Hgb A1c MFr Bld: 5.5 % (ref 4.8–5.6)
Mean Plasma Glucose: 111 mg/dL

## 2016-01-23 MED ORDER — OLANZAPINE 5 MG PO TBDP: 5.0000 mg | ORAL_TABLET | Freq: Three times a day (TID) | ORAL | Status: DC | PRN

## 2016-01-23 MED ORDER — BENZTROPINE MESYLATE 0.5 MG PO TABS
0.5000 mg | ORAL_TABLET | Freq: Every day | ORAL | Status: DC
  Administered 2016-01-23 – 2016-01-24 (×2): 0.5 mg via ORAL
  Filled 2016-01-23 (×2): qty 1
  Filled 2016-01-23: qty 7
  Filled 2016-01-23: qty 1

## 2016-01-23 MED ORDER — OLANZAPINE 10 MG IM SOLR: 5.0000 mg | Freq: Three times a day (TID) | INTRAMUSCULAR | Status: DC | PRN

## 2016-01-23 MED ORDER — HYDROXYZINE HCL 25 MG PO TABS
25.0000 mg | ORAL_TABLET | Freq: Four times a day (QID) | ORAL | Status: DC | PRN
  Administered 2016-01-24: 25 mg via ORAL
  Filled 2016-01-23: qty 1
  Filled 2016-01-23: qty 12

## 2016-01-23 NOTE — Progress Notes (Signed)
0700-1900  D:  Pt presents with anxious mood, though pleasant and cooperative. States, "I am getting better one day at a time, I am getting help that I really needed."  "I want to work on my attitude today. Pt rates depression, anxiety and hopelessness 0/10.    A:  Encouraged to verbalize needs and concerns, active listening and support provided.  Continued Q 15 minute safety checks.  Observed active participation in group settings.  R:  Pt. denies A/V hallucinations and is able to verbally contract for safety. Pt. Remains safe in unit.

## 2016-01-23 NOTE — [facility] Group Notes (Signed)
[facility] LCSW Group Therapy  01/23/2016 1:15 pm  Type of Therapy: Process Group Therapy  Participation Level:  Active  Participation Quality:  Appropriate  Affect:  Flat  Cognitive:  Oriented  Insight:  Improving  Engagement in Group:  Limited  Engagement in Therapy:  Limited  Modes of Intervention:  Activity, Clarification, Education, Problem-solving and Support  Summary of Progress/Problems: Today's group addressed the issue of overcoming obstacles.  Patients were asked to identify their biggest obstacle post d/c that stands in the way of their on-going success, and then problem solve as to how to manage this.  Was distracted and unable to focus on the topic as he found out he is not able to return to his mother's home, and is concerned about being homeless.  We tried calling his mother after group, but got no answer.  Michael Daugherty, Sonal Dorwart B 01/23/2016   3:21 PM

## 2016-01-23 NOTE — Progress Notes (Signed)
Adult Psychoeducational Group Note  Date:  01/23/2016 Time:  8:58 PM  Group Topic/Focus:  Wrap-Up Group:   The focus of this group is to help patients review their daily goal of treatment and discuss progress on daily workbooks.  Participation Level:  Active  Participation Quality:  Appropriate  Affect:  Appropriate  Cognitive:  Appropriate  Insight: Appropriate  Engagement in Group:  Engaged  Modes of Intervention:  Discussion  Additional Comments: The patient expressed that he attended group.The patient also said that group was about thinking positive.  Michael Daugherty, Michael Daugherty 01/23/2016, 8:58 PM

## 2016-01-23 NOTE — Progress Notes (Signed)
[facility] MD Progress Note  01/23/2016 1:28 PM Michael Daugherty  MRN:  [id number] Subjective:  Patient reports " I do have mood swings - it comes and goes, I am not sure what happened , but I had an argument with my mom and I tried to pull the rug under her foot , but she did not fall, thank God for that."    Objective: Michael Daugherty is awake, alert and oriented X3 . Pt reports that he has mood swings that comes and goes , he is irritable at times . He reports that his current medications are effective , has been helping him , he denies ADRs. Pt however continues to be anxious , mostly about a place to go to , since his mother would not let him go back home. Pt advised to follow up with CSW for possible placement options. Pt reports abusing cannabis daily - 2 blunts and alcohol - mild abuse daily . Pt provided with substance abuse counseling - he is willing to quit . Per staff - denies disruptive issues - is compliant on medications.   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 Cannabis use disorder, moderate, dependence (HCC) [F12.20] 01/23/2016  . Alcohol use disorder, mild, abuse [F10.10] 01/23/2016  . Hyperlipidemia [E78.5] 01/23/2016  . Hx of seizure disorder [Z86.69] 01/23/2016  . Intermittent explosive disorder [F63.81] 01/20/2016  . Dental abscess [K04.7] 10/19/2015  . Constipation [K59.00] 10/19/2015  . Environmental allergies [Z91.048] 08/24/2014  . Excess ear wax [H61.20] 08/24/2014  . Dyspepsia [R10.13] 08/24/2014  . Nasal congestion [R09.81] 08/24/2014   Total Time spent with patient: 30 minutes  Past Psychiatric History: denies past hx of mental illness  Past Medical History:  Past Medical History  Diagnosis Date   . Attention deficit disorder (ADD)   . Seasonal allergies     Past Surgical History  Procedure Laterality Date  . Rhinoplasty     Family History:  Family History  Problem Relation Age of Onset  . Diabetes Mother   . Stomach cancer Mother   . Cancer Father   . Diabetes Brother   . Mental illness Neg Hx    Family Psychiatric  History: See H&P Social History: single , unemployed, lives with mother . History  Alcohol Use No     History  Drug Use No    Social History   Social History  . Marital Status: Single    Spouse Name: N/A  . Number of Children: N/A  . Years of Education: N/A   Social History Main Topics  . Smoking status: Former Games developer  . Smokeless tobacco: None  . Alcohol Use: No  . Drug Use: No  . Sexual Activity: Not Asked   Other Topics Concern  . None   Social History Narrative   Additional Social History:                         Sleep: Fair  Appetite:  Fair  Current Medications: Current Facility-Administered Medications  Medication Dose Route Frequency Provider Last Rate Last Dose  . acetaminophen (TYLENOL) tablet 650 mg  650 mg Oral Q6H PRN Earney Navy, NP      . alum & mag hydroxide-simeth (MAALOX/MYLANTA) 200-200-20 MG/5ML suspension 30 mL  30 mL Oral  Q4H PRN Earney Navy, NP   30 mL at 01/21/16 0504  . benztropine (COGENTIN) tablet 0.5 mg  0.5 mg Oral QHS Nkenge Sonntag, MD      . divalproex (DEPAKOTE ER) 24 hr tablet 500 mg  500 mg Oral QHS Rockey Situ Cobos, MD   500 mg at 01/22/16 2104  . haloperidol (HALDOL) tablet 2 mg  2 mg Oral QHS Craige Cotta, MD   2 mg at 01/22/16 2104  . hydrOXYzine (ATARAX/VISTARIL) tablet 25 mg  25 mg Oral Q6H PRN Jomarie Longs, MD      . loratadine (CLARITIN) tablet 10 mg  10 mg Oral Daily Kristeen Mans, NP   10 mg at 01/23/16 0818  . magnesium hydroxide (MILK OF MAGNESIA) suspension 30 mL  30 mL Oral Daily PRN Earney Navy, NP      . OLANZapine zydis (ZYPREXA) disintegrating  tablet 5 mg  5 mg Oral TID PRN Jomarie Longs, MD       Or  . OLANZapine (ZYPREXA) injection 5 mg  5 mg Intramuscular TID PRN Jomarie Longs, MD      . pantoprazole (PROTONIX) EC tablet 40 mg  40 mg Oral Daily Kristeen Mans, NP   40 mg at 01/23/16 0818  . sodium chloride (OCEAN) 0.65 % nasal spray 1 spray  1 spray Each Nare PRN Kristeen Mans, NP      . traZODone (DESYREL) tablet 50 mg  50 mg Oral QHS PRN Craige Cotta, MD   50 mg at 01/22/16 2104    Lab Results:  Results for orders placed or performed during the hospital encounter of 01/20/16 (from the past 48 hour(s))  Prolactin     Status: Abnormal   Collection Time: 01/21/16  6:20 PM  Result Value Ref Range   Prolactin 15.3 (H) 4.0 - 15.2 ng/mL    Comment: (NOTE) Performed At: [facility] [address] [place], [place] [id number] Mila Homer MD [id number] Performed at [facility]   Lipid panel     Status: Abnormal   Collection Time: 01/21/16  6:20 PM  Result Value Ref Range   Cholesterol 147 0 - 200 mg/dL   Triglycerides 644 (H) <150 mg/dL   HDL 34 (L) >03 mg/dL   Total CHOL/HDL Ratio 4.3 RATIO   VLDL 48 (H) 0 - 40 mg/dL   LDL Cholesterol 65 0 - 99 mg/dL    Comment:        Total Cholesterol/HDL:CHD Risk Coronary Heart Disease Risk Table                     Men   Women  1/2 Average Risk   3.4   3.3  Average Risk       5.0   4.4  2 X Average Risk   9.6   7.1  3 X Average Risk  23.4   11.0        Use the calculated Patient Ratio above and the CHD Risk Table to determine the patient's CHD Risk.        ATP III CLASSIFICATION (LDL):  <100     mg/dL   Optimal  474-259  mg/dL   Near or Above                    Optimal  130-159  mg/dL   Borderline  563-875  mg/dL   High  >643     mg/dL  Very High Performed [facility]   Hemoglobin A1c     Status: None   Collection Time: 01/21/16  6:20 PM  Result Value Ref Range   Hgb A1c MFr Bld 5.5 4.8 - 5.6 %    Comment: (NOTE)          Pre-diabetes: 5.7 - 6.4         Diabetes: >6.4         Glycemic control for adults with diabetes: <7.0    Mean Plasma Glucose 111 mg/dL    Comment: (NOTE) Performed At: [facility] [address] [place], [place] [id number] Mila HomerHancock William F MD NG:[phone] Performed at [facility]     Blood Alcohol level:  Lab Results  Component Value Date   [facility] 01/[facility]/2016    Physical Findings: AIMS: Facial and Oral Movements Muscles of Facial Expression: None, normal Lips and Perioral Area: None, normal Jaw: None, normal Tongue: None, normal,Extremity Movements Upper (arms, wrists, hands, fingers): None, normal Lower (legs, knees, ankles, toes): None, normal, Trunk Movements Neck, shoulders, hips: None, normal, Overall Severity Severity of abnormal movements (highest score from questions above): None, normal Incapacitation due to abnormal movements: None, normal Patient's awareness of abnormal movements (rate only patient's report): No Awareness, Dental Status Current problems with teeth and/or dentures?: No Does patient usually wear dentures?: No  CIWA:    COWS:     Musculoskeletal: Strength & Muscle Tone: within normal limits Gait & Station: normal Patient leans: N/A  Psychiatric Specialty Exam: Review of Systems  Psychiatric/Behavioral: Positive for substance abuse. Negative for depression, suicidal ideas and hallucinations. The patient is nervous/anxious.   All other systems reviewed and are negative.   Blood pressure 104/60, pulse 75, temperature 98.1 F (36.7 C), temperature source Oral, resp. rate 16, height 5\' 7"  (1.702 m), weight 83.462 kg (184 lb).Body mass index is 28.81 kg/(m^2).  General Appearance: Casual  Eye Contact::  Good  Speech:  Clear and Coherent  Volume:  Normal  Mood:  Anxious  Affect:  Congruent  Thought Process:  Linear  Orientation:  Full (Time, Place, and Person)  Thought Content:  Hallucinations: Auditory,  paranoid - improving  Suicidal Thoughts:  No  Homicidal Thoughts:  No denies trying to hurt or kill is mother  Memory:  Immediate;   Fair Recent;   Fair Remote;   Fair  Judgement:  Fair  Insight:  Lacking  Psychomotor Activity:  Normal  Concentration:  Fair  Recall:  [employer]  Fund of Knowledge:Fair  Language: Fair  Akathisia:  No  Handed:  Right  AIMS (if indicated):     Assets:  Desire for Improvement Social Support  ADL's:  Intact  Cognition: WNL  Sleep:  Number of Hours: 5     Treatment Plan Summary:Jaysean presents with anxiety , mood sx and paranoia. Will continue treatment. Daily contact with patient to assess and evaluate symptoms and progress in treatment and Medication management   Will increase Depakote ER to 750 mg po qhs for mood sx. Depakote level in 4 days. Will continue Haldol 2 mg po qhs for psychosis/mood sx. Will add Cogentin 0.5 mg po qhs for EPS. Will make available PRN medications as per agitation protocol. Continue with Trazodone 50 mg po qhs prn for sleep. Will continue to monitor vitals ,medication compliance and treatment side effects while patient is here.  Reviewed labsLipid Panel- abnormal - will recommend diet control, Pl slightly elevated - will recommend out pt follow up .  CSW will continue working in disposition. Patient to participate in therapeutic milieu  Amitai Delaughter, MD 01/23/2016, 1:28 PM

## 2016-01-23 NOTE — [facility] Group Notes (Signed)
[facility]   01/23/2016 9:45 AM  Participation Quality:  Engaged  Mood/Affect:  Flat  Depression Rating:  5  Anxiety Rating:  5  Thoughts of Suicide:  No Will you contract for safety?   NA  Current AVH:  No  Plan for Discharge/Comments:  Nice job redirecting another patient who kept interrupting.  States he was hearing voices and dealing with anger issues with family prior to admission.  Feels remorseful about what happened with mother.  States he will call her and let her know he is willing to follow up outpt and take meds on an on-going business.  Transportation Means:   Supports:  Michael Daugherty, Michael Daugherty

## 2016-01-23 NOTE — Tx Team (Signed)
Interdisciplinary Treatment Plan Update (Adult)  Date:  01/23/2016   Time Reviewed:  8:22 AM   Progress in Treatment: Attending groups: Yes. Participating in groups:  Yes. Taking medication as prescribed:  Yes. Tolerating medication:  Yes. Family/Significant other contact made:  No Patient understands diagnosis:  Yes  As evidenced by seeking help with "my anger" Discussing patient identified problems/goals with staff:  Yes, see initial care plan. Medical problems stabilized or resolved:  Yes. Denies suicidal/homicidal ideation: Yes. Issues/concerns per patient self-inventory:  No. Other:  New problem(s) identified:  Discharge Plan or Barriers: see below  Reason for Continuation of Hospitalization: Aggression Medication stabilization Other; describe Psychosis  Comments:  Michael Daugherty is an 40 y.o. male presenting to [facility] after being petitioned for involuntary commitment by his mother. Pt stated "I got into a disagreement with my moms". "I am 40 and she is 76". "She was saying things that were getting me mad". "I took the rug and yanked it while she was standing on it". "I was wrong for it". Pt denies SI and HI at this time. Pt is reporting auditory and visual hallucinations. Pt reported that sometimes the voices will tell him that he is better off dead and to kill himself. Pt shared that he is dealing with multiple stressors such as unemployment and trying to find his own place. Will increase Depakote ER to 750 mg po qhs for mood sx. Depakote level in 4 days. Will continue Haldol 2 mg po qhs for psychosis/mood sx. Will add Cogentin 0.5 mg po qhs for EPS. Will make available PRN medications as per agitation protocol. Continue with Trazodone 50 mg po qhs prn for sleep.  Estimated length of stay: 1-3 days  New goal(s):  Review of initial/current patient goals per problem list:   Review of initial/current patient goals per problem list:  1. Goal(s): Patient will participate in  aftercare plan   Met: No  Target date: 3-5 days post admission date   As evidenced by: Patient will participate within aftercare plan AEB aftercare provider and housing plan at discharge being identified. 01/23/16:  Unsure of where he will go as mother is saying he cannot return there.    5. Goal(s): Patient will demonstrate decreased signs of psychosis  * Met: No  * Target date: 3-5 days post admission date  * As evidenced by: Patient will demonstrate decreased frequency of AVH or return to baseline function 01/23/16:  Was endorsing psychosis prior to admission.  Had not been on any meds.     6. Goal (s): Patient will demonstrate decreased signs of mania  * Met: No  * Target date: 3-5 days post admission date  * As evidenced by: Patient demonstrate decreased signs of mania AEB decreased mood instability and return to baseline functioning 01/23/16:  Was irritable, angry and aggressive prior to admission [per IVC paperwork].  Depakote started here.      Attendees: Patient:  01/23/2016 8:22 AM   Family:   01/23/2016 8:22 AM   Physician:  Ursula Alert, MD 01/23/2016 8:22 AM   Nursing:   Grayland Ormond, RN 01/23/2016 8:22 AM   CSW:    Roque Lias, LCSW   01/23/2016 8:22 AM   Other:  01/23/2016 8:22 AM   Other:   01/23/2016 8:22 AM   Other:  Lars Pinks, Nurse CM 01/23/2016 8:22 AM   Other:   01/23/2016 8:22 AM   Other:  Norberto Sorenson, [place]  01/23/2016 8:22 AM   Other:  01/23/2016 8:22  AM   Other:  01/23/2016 8:22 AM   Other:  01/23/2016 8:22 AM   Other:  01/23/2016 8:22 AM   Other:  01/23/2016 8:22 AM   Other:   01/23/2016 8:22 AM    Scribe for Treatment Team:   Trish Mage, 01/23/2016 8:22 AM

## 2016-01-23 NOTE — Progress Notes (Signed)
D   Pt can be somewhat intrusive and preoccupied   He talked about his medications and received information about meds he would get tonight  Pt is compliant with treatment and has appropriate interaction with others    Pt is pleasant on approach and cooperative A    Verbal support given   Medications administered and effectiveness monitored    Q 15 min checks R   Pt safe at present

## 2016-01-24 NOTE — [facility] Group Notes (Signed)
[facility] Group Notes:  (Nursing/MHT/Case Management/Adjunct)  Date:  01/24/2016  Time:  10:20 AM  Type of Therapy:  Group Therapy  Participation Level:  Active  Participation Quality:  Appropriate  Affect:  Appropriate  Cognitive:  Alert and Appropriate  Insight:  Appropriate and Improving  Engagement in Group:  Engaged and Improving  Modes of Intervention:  Discussion and Education  Summary of Progress/Problems:Appropriate participation in group. Engaged and insightful. Planning for discharge today.  Wynona LunaBeck, Leonard Feigel K 01/24/2016, 10:20 AM

## 2016-01-24 NOTE — Progress Notes (Signed)
Patient ID: Michael CedarsDerrick Landrus, male   DOB: 12/08/1975, 40 y.o.   MRN: [id number] D-stated reason he is here is because he is homeless. States he cant go home to live with his mom who he had been residing with because his siblings are trying to put her into a group home or another type of living situation because she is [age] and cant continue to live alone and pay her bills. He did not mention him being aggressive toward her. He states he will be going to the shelter, and has not lived in such a place previously. Pleasant, verbal and active on the unit. He has some expectation he will be leaving today, but is not due to no available bed [place]. A-Support offered. Monitored for safety and medications as ordered. R-Attended am groups. No complaints voiced. Is aware he will not be discharge today and is OK with it for now.

## 2016-01-24 NOTE — Progress Notes (Signed)
Patient ID: Michael Daugherty, male   DOB: [birth year], 40 y.o.   MRN: [id number] Verbalized his disapointment he was not discharged today as was expected. Reminded he wasn't discharged because there was no bed available for him at the Shelter. Didn't want to discharge him to an unsafe place like the streets. He backed off his claim that people should be honest with him and the other peers on the unit, rather than lie to them, and his example was that staff told him he was going to be discharged today. Plan is for him to be discharged tomorrow. He was asked by Rod to come up with plan B. His plan B [facility] to [facility].

## 2016-01-24 NOTE — [facility] Group Notes (Signed)
Patient said said his group was a 10. He learn a lot from the group session and patient said he will be leaving soon

## 2016-01-24 NOTE — Progress Notes (Signed)
Adult Psychoeducational Group Note  Date:  01/24/2016 Time:  9:05 PM  Group Topic/Focus:  Wrap-Up Group:   The focus of this group is to help patients review their daily goal of treatment and discuss progress on daily workbooks.  Participation Level:  Active  Participation Quality:  Appropriate  Affect:  Appropriate  Cognitive:  Alert  Insight: Appropriate  Engagement in Group:  Engaged  Modes of Intervention:  Discussion  Additional Comments:  Patient states "today wasn't a good day, I was suppose to discharge today". Patient goal(s) for today was to be positive, take one day at a time, and to not worry so much".  Jillian Pianka L Florentine Diekman 01/24/2016, 9:05 PM

## 2016-01-24 NOTE — Progress Notes (Signed)
[name]  01/24/2016 1:53 PM Michael CedarsDerrick Daugherty  MRN:  [id number] Subjective:  Patient reports " I feel anxious about my situation.'     Objective: Michael Daugherty is awake, alert and oriented X3 . Pt continues to report mood swings - but is improving. Pt denies any new concerns , is tolerating medications well. Per staff - pt has been compliant on medications , is visible in milieu . Will continue to encourage and support.    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 Cannabis use disorder, moderate, dependence (HCC) [F12.20] 01/23/2016  . Alcohol use disorder, mild, abuse [F10.10] 01/23/2016  . Hyperlipidemia [E78.5] 01/23/2016  . Hx of seizure disorder [Z86.69] 01/23/2016  . Intermittent explosive disorder [F63.81] 01/20/2016  . Dental abscess [K04.7] 10/19/2015  . Constipation [K59.00] 10/19/2015  . Environmental allergies [Z91.048] 08/24/2014  . Excess ear wax [H61.20] 08/24/2014  . Dyspepsia [R10.13] 08/24/2014  . Nasal congestion [R09.81] 08/24/2014   Total Time spent with patient: 20 minutes  Past Psychiatric History: denies past hx of mental illness  Past Medical History:  Past Medical History  Diagnosis Date  . Attention deficit disorder (ADD)   . Seasonal allergies     Past Surgical History  Procedure Laterality Date  . Rhinoplasty     Family History:  Family History  Problem Relation Age of Onset  . Diabetes Mother   . Stomach cancer Mother   . Cancer Father   . Diabetes Brother   . Mental illness Neg Hx    Family Psychiatric  History: See H&P Social History: single , unemployed, lives with mother . History  Alcohol Use No     History  Drug Use No    Social History   Social  History  . Marital Status: Single    Spouse Name: N/A  . Number of Children: N/A  . Years of Education: N/A   Social History Main Topics  . Smoking status: Former Games developermoker  . Smokeless tobacco: None  . Alcohol Use: No  . Drug Use: No  . Sexual Activity: Not Asked   Other Topics Concern  . None   Social History Narrative   Additional Social History:                         Sleep: Fair  Appetite:  Fair  Current Medications: Current Facility-Administered Medications  Medication Dose Route Frequency Provider Last Rate Last Dose  . acetaminophen (TYLENOL) tablet 650 mg  650 mg Oral Q6H PRN Earney NavyJosephine C Onuoha, NP      . alum & mag hydroxide-simeth (MAALOX/MYLANTA) 200-200-20 MG/5ML suspension 30 mL  30 mL Oral Q4H PRN Earney NavyJosephine C Onuoha, NP   30 mL at 01/21/16 0504  . benztropine (COGENTIN) tablet 0.5 mg  0.5 mg Oral QHS Jomarie LongsSaramma Vansh Reckart, MD   0.5 mg at 01/23/16 2107  . divalproex (DEPAKOTE ER) 24 hr tablet 500 mg  500 mg Oral QHS Rockey SituFernando A Cobos, MD   500 mg at 01/23/16 2106  . haloperidol (HALDOL) tablet 2 mg  2 mg Oral QHS Rockey SituFernando A Cobos, MD   2 mg at 01/23/16 2106  . hydrOXYzine (ATARAX/VISTARIL) tablet 25 mg  25 mg Oral Q6H PRN Jomarie LongsSaramma Alecxander Mainwaring, MD      .  loratadine (CLARITIN) tablet 10 mg  10 mg Oral Daily Kristeen Mans, NP   10 mg at 01/24/16 0819  . magnesium hydroxide (MILK OF MAGNESIA) suspension 30 mL  30 mL Oral Daily PRN Earney Navy, NP      . OLANZapine zydis (ZYPREXA) disintegrating tablet 5 mg  5 mg Oral TID PRN Jomarie Longs, MD       Or  . OLANZapine (ZYPREXA) injection 5 mg  5 mg Intramuscular TID PRN Jomarie Longs, MD      . pantoprazole (PROTONIX) EC tablet 40 mg  40 mg Oral Daily Kristeen Mans, NP   40 mg at 01/24/16 0819  . sodium chloride (OCEAN) 0.65 % nasal spray 1 spray  1 spray Each Nare PRN Kristeen Mans, NP      . traZODone (DESYREL) tablet 50 mg  50 mg Oral QHS PRN Craige Cotta, MD   50 mg at 01/22/16 2104    Lab Results:  No  results found for this or any previous visit (from the past 48 hour(s)).  Blood Alcohol level:  Lab Results  Component Value Date   ETH <5 01/20/2016    Physical Findings: AIMS: Facial and Oral Movements Muscles of Facial Expression: None, normal Lips and Perioral Area: None, normal Jaw: None, normal Tongue: None, normal,Extremity Movements Upper (arms, wrists, hands, fingers): None, normal Lower (legs, knees, ankles, toes): None, normal, Trunk Movements Neck, shoulders, hips: None, normal, Overall Severity Severity of abnormal movements (highest score from questions above): None, normal Incapacitation due to abnormal movements: None, normal Patient's awareness of abnormal movements (rate only patient's report): No Awareness, Dental Status Current problems with teeth and/or dentures?: No Does patient usually wear dentures?: No  CIWA:    COWS:     Musculoskeletal: Strength & Muscle Tone: within normal limits Gait & Station: normal Patient leans: N/A  Psychiatric Specialty Exam: Review of Systems  Psychiatric/Behavioral: Positive for substance abuse. Negative for depression, suicidal ideas and hallucinations. The patient is nervous/anxious.   All other systems reviewed and are negative.   Blood pressure 118/76, pulse 62, temperature 98.2 F (36.8 C), temperature source Oral, resp. rate 20, height  (1.702 m), weight 83.462 kg (184 lb).Body mass index is 28.81 kg/(m^2).  General Appearance: Casual  Eye Contact::  Good  Speech:  Clear and Coherent  Volume:  Normal  Mood:  Anxious  Affect:  Congruent  Thought Process:  Linear  Orientation:  Full (Time, Place, and Person)  Thought Content:  Hallucinations: Auditory, paranoid - improving  Suicidal Thoughts:  No  Homicidal Thoughts:  No denies trying to hurt or kill is mother  Memory:  Immediate;   Fair Recent;   Fair Remote;   Fair  Judgement:  Fair  Insight:  Lacking  Psychomotor Activity:  Normal  Concentration:   Fair  Recall:  [employer] of Knowledge:Fair  Language: Fair  Akathisia:  No  Handed:  Right  AIMS (if indicated):     Assets:  Desire for Improvement Social Support  ADL's:  Intact  Cognition: WNL  Sleep:  Number of Hours: 5.5     Treatment Plan Summary:Reynaldo presents with anxiety , mood sx and paranoia,although progressing . Will continue treatment. Daily contact with patient to assess and evaluate symptoms and progress in treatment and Medication management   Will continue Depakote ER  750 mg po qhs for mood sx. Depakote level in 4 days. Will continue Haldol 2 mg po qhs for psychosis/mood sx.  Will continue Cogentin 0.5 mg po qhs for EPS. Will make available PRN medications as per agitation protocol. Continue with Trazodone 50 mg po qhs prn for sleep. Will continue to monitor vitals ,medication compliance and treatment side effects while patient is here.  Reviewed labsLipid Panel- abnormal - will recommend diet control, Pl slightly elevated - will recommend out pt follow up . CSW will continue working in disposition. Patient to participate in therapeutic milieu  Cashawn Yanko, MD 01/24/2016, 1:53 PM

## 2016-01-24 NOTE — [facility] Group Notes (Signed)
[facility] LCSW Group Therapy  01/24/2016 , 1:35 PM   Type of Therapy:  Group Therapy  Participation Level:  Active  Participation Quality:  Attentive  Affect:  Appropriate  Cognitive:  Alert  Insight:  Improving  Engagement in Therapy:  Engaged  Modes of Intervention:  Discussion, Exploration and Socialization  Summary of Progress/Problems: Today's group focused on the term Diagnosis.  Participants were asked to define the term, and then pronounce whether it is a negative, positive or neutral term.  Difficulty concentrating and attending to the subject matter.  "First I'm told I'm leaving, then I'm told I'm not.  Why are y'all jerking me around like this?"  When pointed out to him that there was no bed available at the shelter and that we wanted him to have time to put together a back up plan, he shrugged it off.  Continued to talk over me when I attempted to problem solve with him.  Did not contribute to group discussion.  Daryel Geraldorth, Tyshon Fanning B 01/24/2016 , 1:35 PM

## 2016-01-25 MED ORDER — DIVALPROEX SODIUM ER 500 MG PO TB24: 500.0000 mg | ORAL_TABLET | Freq: Every day | ORAL | Status: DC

## 2016-01-25 MED ORDER — SALINE SPRAY 0.65 % NA SOLN: 1.0000 | NASAL | Status: DC | PRN

## 2016-01-25 MED ORDER — CETIRIZINE HCL 10 MG PO TABS: 10.0000 mg | ORAL_TABLET | Freq: Every day | ORAL | Status: DC

## 2016-01-25 MED ORDER — HYDROXYZINE HCL 25 MG PO TABS: 25.0000 mg | ORAL_TABLET | Freq: Four times a day (QID) | ORAL | Status: DC | PRN

## 2016-01-25 MED ORDER — BENZTROPINE MESYLATE 0.5 MG PO TABS: 0.5000 mg | ORAL_TABLET | Freq: Every day | ORAL | Status: DC

## 2016-01-25 MED ORDER — TRAZODONE HCL 50 MG PO TABS: 50.0000 mg | ORAL_TABLET | Freq: Every evening | ORAL | Status: DC | PRN

## 2016-01-25 MED ORDER — HALOPERIDOL 2 MG PO TABS: 2.0000 mg | ORAL_TABLET | Freq: Every day | ORAL | Status: DC

## 2016-01-25 MED ORDER — PANTOPRAZOLE SODIUM 40 MG PO TBEC: 40.0000 mg | DELAYED_RELEASE_TABLET | Freq: Every day | ORAL | Status: DC

## 2016-01-25 NOTE — Progress Notes (Signed)
  [facility] :  Will you be returning to the same living situation after discharge:  Yes,  home At discharge, do you have transportation home?: Yes,  bus pass Do you have the ability to pay for your medications: Yes,  mental health  Release of information consent forms completed and in the chart;  Patient's signature needed at discharge.  Patient to Follow up at: Follow-up Information    [facility]   Specialty:  Behavioral Health   Why:  Go to the walk-in clinic M-F between 8 and 11AM for your hospital follow up appointment   Contact information:   [address] [place] [address] [phone]       Next level of care provider has [facility] to [facility] Link:no  Safety Planning and Suicide Prevention discussed: Yes,  yes  Have you used any form of tobacco in the last 30 days? (Cigarettes, Smokeless Tobacco, Cigars, and/or Pipes): Yes  Has patient been referred to the Quitline?: Patient refused referral  Patient has been referred for addiction treatment: N/A  Daryel Geraldorth, Kollyns Mickelson B 01/25/2016, 11:04 AM

## 2016-01-25 NOTE — [facility] Suicide Risk Assessment (Signed)
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 Cannabis use disorder, moderate, dependence (HCC) [F12.20] 01/23/2016  . Alcohol use disorder, mild, abuse [F10.10] 01/23/2016  . Hyperlipidemia [E78.5] 01/23/2016  . Hx of seizure disorder [Z86.69] 01/23/2016  . Intermittent explosive disorder [F63.81] 01/20/2016  . Dental abscess [K04.7] 10/19/2015  . Constipation [K59.00] 10/19/2015  . Environmental allergies [Z91.048] 08/24/2014  . Excess ear wax [H61.20] 08/24/2014  . Dyspepsia [R10.13] 08/24/2014  . Nasal congestion [R09.81] 08/24/2014    Total Time spent with patient: 30 minutes  Musculoskeletal: Strength & Muscle Tone: within normal limits Gait & Station: normal Patient leans: N/A  Psychiatric Specialty Exam: Review of Systems  Psychiatric/Behavioral: Positive for substance abuse. Negative for depression.  All other systems reviewed and are negative.   Blood pressure 132/78, pulse 67, temperature 97.9 F (36.6 C), temperature source Oral, resp. rate 16, height 5\' 7"  (1.702 m), weight 83.462 kg (184 lb).Body mass index is 28.81 kg/(m^2).  General Appearance: Casual  Eye Contact::  Fair  Speech:  Clear and Coherent409  Volume:  Normal  Mood:  Euthymic  Affect:  Congruent  Thought Process:  Goal Directed  Orientation:  Full (Time, Place, and Person)  Thought Content:  WDL  Suicidal Thoughts:  No  Homicidal Thoughts:  No  Memory:  Immediate;   Fair Recent;   Fair Remote;   Fair  Judgement:  Fair  Insight:  Fair  Psychomotor Activity:  Normal  Concentration:  Fair  Recall:  [employer]  Fund of Knowledge:Fair  Language: Fair  Akathisia:  No  Handed:  Right  AIMS (if  indicated):   0  Assets:  Desire for Improvement  Sleep:  Number of Hours: 6.25  Cognition: WNL  ADL's:  Intact   Mental Status Per Nursing Assessment::   On Admission:  Suicidal ideation indicated by patient, Suicide plan, Self-harm thoughts  Demographic Factors:  Male  Loss Factors: Financial problems/change in socioeconomic status  Historical Factors: Impulsivity  Risk Reduction Factors:   Positive coping skills or problem solving skills  Continued Clinical Symptoms:  Alcohol/Substance Abuse/Dependencies  Cognitive Features That Contribute To Risk:  None    Suicide Risk:  Minimal: No identifiable suicidal ideation.  Patients presenting with no risk factors but with morbid ruminations; may be classified as minimal risk based on the severity of the depressive symptoms  Follow-up Information    [facility]   Specialty:  Behavioral Health   Why:  Go to the walk-in clinic M-F between 8 and 11AM for your hospital follow up appointment   Contact information:   [address] [place] [address] [phone]       Plan Of Care/Follow-up recommendations:  Activity:  no restrictions Diet:  regular Tests:  as needed Other:  follow up with your aftercare  Phillip Sandler, MD 01/25/2016, 9:19 AM
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
## 2016-01-25 NOTE — Progress Notes (Signed)
D   Pt reports being disappointed about not getting discharged today but was able to understand why and hope for discharge tomorrow   He is appropriate and pleasant with others and is compliant with treatment A   Verbal  support given    Medications administered and effectiveness monitored   Q 15 min checks R   Pt is presently safe and receptive to verbal support

## 2016-01-25 NOTE — Progress Notes (Signed)
Pt d/c to mothers home via bus pass. D/c instructions, rx's, samples, and suicide prevention information given and reviewed. Pt verbalizes understanding. Pt denies s.i.
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
## 2016-02-13 MED FILL — POLYETHYLENE GLYCOL 3350: 30 days supply | Qty: 510 | Fill #0

## 2016-02-13 MED FILL — ?PANTOPRAZOLE SOD DR 40MG: 40 MG | 30 days supply | Qty: 30 | Fill #1

## 2016-02-17 MED FILL — hydrOXYzine HCL 25 MG TABS: 25 | 15 days supply | Qty: 60 | Fill #0

## 2016-02-17 MED FILL — DIVALPROEX SOD ER 500 MG TA: 500 | 30 days supply | Qty: 30 | Fill #0

## 2016-02-17 MED FILL — HALOPERIDOL 2 MG TABLET: 2 | 30 days supply | Qty: 30 | Fill #0

## 2016-02-17 MED FILL — traZODone HCL 50 MG TABS: 50 | 30 days supply | Qty: 30 | Fill #0

## 2016-02-17 MED FILL — BENZTROPINE MES 0.5 MG TAB: 0.5 | 30 days supply | Qty: 30 | Fill #0

## 2016-05-08 MED FILL — ?PANTOPRAZOLE SOD DR 40MG: 40 MG | 30 days supply | Qty: 30 | Fill #2

## 2016-05-08 MED FILL — POLYETHYLENE GLYCOL 3350: 30 days supply | Qty: 510 | Fill #1

## 2016-05-18 ENCOUNTER — Ambulatory Visit: Payer: Self-pay | Attending: Family Medicine

## 2016-05-31 ENCOUNTER — Ambulatory Visit: Payer: Self-pay | Attending: Internal Medicine | Admitting: Physician Assistant

## 2016-05-31 VITALS — BP 146/83 | HR 70 | Temp 98.2°F | Resp 16 | Wt 190.8 lb

## 2016-05-31 DIAGNOSIS — J069 Acute upper respiratory infection, unspecified: Secondary | ICD-10-CM

## 2016-05-31 DIAGNOSIS — Z9109 Other allergy status, other than to drugs and biological substances: Secondary | ICD-10-CM

## 2016-05-31 DIAGNOSIS — Z91048 Other nonmedicinal substance allergy status: Secondary | ICD-10-CM

## 2016-05-31 DIAGNOSIS — R0981 Nasal congestion: Secondary | ICD-10-CM

## 2016-05-31 DIAGNOSIS — K5909 Other constipation: Secondary | ICD-10-CM

## 2016-05-31 DIAGNOSIS — R1013 Epigastric pain: Secondary | ICD-10-CM

## 2016-05-31 MED ORDER — PANTOPRAZOLE SODIUM 40 MG PO TBEC: 40.0000 mg | DELAYED_RELEASE_TABLET | Freq: Every day | ORAL | 2 refills | Status: DC

## 2016-05-31 MED ORDER — POLYETHYLENE GLYCOL 3350 17 GM/SCOOP PO POWD: 17.0000 g | Freq: Two times a day (BID) | ORAL | 1 refills | Status: DC | PRN

## 2016-05-31 MED ORDER — CETIRIZINE HCL 10 MG PO TABS: 10.0000 mg | ORAL_TABLET | Freq: Every day | ORAL | 2 refills | Status: AC

## 2016-05-31 MED FILL — ?PANTOPRAZOLE SOD DR 40MG: 40 MG | 30 days supply | Qty: 30 | Fill #3

## 2016-05-31 MED FILL — ?CETIRIZINE HCL 10 MG TABLE: 10 | 30 days supply | Qty: 30 | Fill #0

## 2016-05-31 NOTE — Patient Instructions (Signed)
Phenylephrine 10mg  (over the counter) for nasal congestion   Gastroesophageal Reflux Disease, Adult Normally, food travels down the esophagus and stays in the stomach to be digested. However, when a person has gastroesophageal reflux disease (GERD), food and stomach acid move back up into the esophagus. When this happens, the esophagus becomes sore and inflamed. Over time, GERD can create small holes (ulcers) in the lining of the esophagus.  CAUSES This condition is caused by a problem with the muscle between the esophagus and the stomach (lower esophageal sphincter, or LES). Normally, the LES muscle closes after food passes through the esophagus to the stomach. When the LES is weakened or abnormal, it does not close properly, and that allows food and stomach acid to go back up into the esophagus. The LES can be weakened by certain dietary substances, medicines, and medical conditions, including:  Tobacco use.  Pregnancy.  Having a hiatal hernia.  Heavy alcohol use.  Certain foods and beverages, such as coffee, chocolate, onions, and peppermint. RISK FACTORS This condition is more likely to develop in:  People who have an increased body weight.  People who have connective tissue disorders.  People who use NSAID medicines. SYMPTOMS Symptoms of this condition include:  Heartburn.  Difficult or painful swallowing.  The feeling of having a lump in the throat.  Abitter taste in the mouth.  Bad breath.  Having a large amount of saliva.  Having an upset or bloated stomach.  Belching.  Chest pain.  Shortness of breath or wheezing.  Ongoing (chronic) cough or a night-time cough.  Wearing away of tooth enamel.  Weight loss. Different conditions can cause chest pain. Make sure to see your health care provider if you experience chest pain. DIAGNOSIS Your health care provider will take a medical history and perform a physical exam. To determine if you have mild or severe  GERD, your health care provider may also monitor how you respond to treatment. You may also have other tests, including:  An endoscopy toexamine your stomach and esophagus with a small camera.  A test thatmeasures the acidity level in your esophagus.  A test thatmeasures how much pressure is on your esophagus.  A barium swallow or modified barium swallow to show the shape, size, and functioning of your esophagus. TREATMENT The goal of treatment is to help relieve your symptoms and to prevent complications. Treatment for this condition may vary depending on how severe your symptoms are. Your health care provider may recommend:  Changes to your diet.  Medicine.  Surgery. HOME CARE INSTRUCTIONS Diet  Follow a diet as recommended by your health care provider. This may involve avoiding foods and drinks such as:  Coffee and tea (with or without caffeine).  Drinks that containalcohol.  Energy drinks and sports drinks.  Carbonated drinks or sodas.  Chocolate and cocoa.  Peppermint and mint flavorings.  Garlic and onions.  Horseradish.  Spicy and acidic foods, including peppers, chili powder, curry powder, vinegar, hot sauces, and barbecue sauce.  Citrus fruit juices and citrus fruits, such as oranges, lemons, and limes.  Tomato-based foods, such as red sauce, chili, salsa, and pizza with red sauce.  Fried and fatty foods, such as donuts, french fries, potato chips, and high-fat dressings.  High-fat meats, such as hot dogs and fatty cuts of red and white meats, such as rib eye steak, sausage, ham, and bacon.  High-fat dairy items, such as whole milk, butter, and cream cheese.  Eat small, frequent meals instead of large  meals.  Avoid drinking large amounts of liquid with your meals.  Avoid eating meals during the 2-3 hours before bedtime.  Avoid lying down right after you eat.  Do not exercise right after you eat. General Instructions  Pay attention to any  changes in your symptoms.  Take over-the-counter and prescription medicines only as told by your health care provider. Do not take aspirin, ibuprofen, or other NSAIDs unless your health care provider told you to do so.  Do not use any tobacco products, including cigarettes, chewing tobacco, and e-cigarettes. If you need help quitting, ask your health care provider.  Wear loose-fitting clothing. Do not wear anything tight around your waist that causes pressure on your abdomen.  Raise (elevate) the head of your bed 6 inches (15cm).  Try to reduce your stress, such as with yoga or meditation. If you need help reducing stress, ask your health care provider.  If you are overweight, reduce your weight to an amount that is healthy for you. Ask your health care provider for guidance about a safe weight loss goal.  Keep all follow-up visits as told by your health care provider. This is important. SEEK MEDICAL CARE IF:  You have new symptoms.  You have unexplained weight loss.  You have difficulty swallowing, or it hurts to swallow.  You have wheezing or a persistent cough.  Your symptoms do not improve with treatment.  You have a hoarse voice. SEEK IMMEDIATE MEDICAL CARE IF:  You have pain in your arms, neck, jaw, teeth, or back.  You feel sweaty, dizzy, or light-headed.  You have chest pain or shortness of breath.  You vomit and your vomit looks like blood or coffee grounds.  You faint.  Your stool is bloody or black.  You cannot swallow, drink, or eat.   This information is not intended to replace advice given to you by your health care provider. Make sure you discuss any questions you have with your health care provider.   Document Released: 07/04/2005 Document Revised: 06/15/2015 Document Reviewed: 01/19/2015 Elsevier Interactive Patient Education 2016 Elsevier Inc. Irritable Bowel Syndrome, Adult Irritable bowel syndrome (IBS) is not one specific disease. It is a group  of symptoms that affects the organs responsible for digestion (gastrointestinal or GI tract).  To regulate how your GI tract works, your body sends signals back and forth between your intestines and your brain. If you have IBS, there may be a problem with these signals. As a result, your GI tract does not function normally. Your intestines may become more sensitive and overreact to certain things. This is especially true when you eat certain foods or when you are under stress.  There are four types of IBS. These may be determined based on the consistency of your stool:   IBS with diarrhea.   IBS with constipation.   Mixed IBS.   Unsubtyped IBS.  It is important to know which type of IBS you have. Some treatments are more likely to be helpful for certain types of IBS.  CAUSES  The exact cause of IBS is not known. RISK FACTORS You may have a higher risk of IBS if:  You are a woman.  You are younger than 38 years old.  You have a family history of IBS.  You have mental health problems.  You have had bacterial infection of your GI tract. SIGNS AND SYMPTOMS  Symptoms of IBS vary from person to person. The main symptom is abdominal pain or discomfort. Additional symptoms  usually include one or more of the following:   Diarrhea, constipation, or both.   Abdominal swelling or bloating.   Feeling full or sick after eating a small or regular-size meal.   Frequent gas.   Mucus in the stool.   A feeling of having more stool left after a bowel movement.  Symptoms tend to come and go. They may be associated with stress, psychiatric conditions, or nothing at all.  DIAGNOSIS  There is no specific test to diagnose IBS. Your health care provider will make a diagnosis based on a physical exam, medical history, and your symptoms. You may have other tests to rule out other conditions that may be causing your symptoms. These may include:   Blood tests.   X-rays.   CT  scan.  Endoscopy and colonoscopy. This is a test in which your GI tract is viewed with a long, thin, flexible tube. TREATMENT There is no cure for IBS, but treatment can help relieve symptoms. IBS treatment often includes:   Changes to your diet, such as:  Eating more fiber.  Avoiding foods that cause symptoms.  Drinking more water.  Eating regular, medium-sized portioned meals.  Medicines. These may include:  Fiber supplements if you have constipation.  Medicine to control diarrhea (antidiarrheal medicines).  Medicine to help control muscle spasms in your GI tract (antispasmodic medicines).  Medicines to help with any mental health issues, such as antidepressants or tranquilizers.  Therapy.  Talk therapy may help with anxiety, depression, or other mental health issues that can make IBS symptoms worse.  Stress reduction.  Managing your stress can help keep symptoms under control. HOME CARE INSTRUCTIONS   Take medicines only as directed by your health care provider.  Eat a healthy diet.  Avoid foods and drinks with added sugar.  Include more whole grains, fruits, and vegetables gradually into your diet. This may be especially helpful if you have IBS with constipation.  Avoid any foods and drinks that make your symptoms worse. These may include dairy products and caffeinated or carbonated drinks.  Do not eat large meals.  Drink enough fluid to keep your urine clear or pale yellow.  Exercise regularly. Ask your health care provider for recommendations of good activities for you.  Keep all follow-up visits as directed by your health care provider. This is important. SEEK MEDICAL CARE IF:   You have constant pain.  You have trouble or pain with swallowing.  You have worsening diarrhea. SEEK IMMEDIATE MEDICAL CARE IF:   You have severe and worsening abdominal pain.   You have diarrhea and:   You have a rash, stiff neck, or severe headache.   You are  irritable, sleepy, or difficult to awaken.   You are weak, dizzy, or extremely thirsty.   You have bright red blood in your stool or you have black tarry stools.   You have unusual abdominal swelling that is painful.   You vomit continuously.   You vomit blood (hematemesis).   You have both abdominal pain and a fever.    This information is not intended to replace advice given to you by your health care provider. Make sure you discuss any questions you have with your health care provider.   Document Released: 09/24/2005 Document Revised: 10/15/2014 Document Reviewed: 06/11/2014 Elsevier Interactive Patient Education [employer]2016 Elsevier Inc.

## 2016-05-31 NOTE — Progress Notes (Signed)
Pt is in the office today for fever and constipation Pt states he is not in any pain Pt states the constipation has been on and off Pt states his fever has started on Saturday Pt states he has been taking allergy medicine

## 2016-05-31 NOTE — Progress Notes (Signed)
Michael Daugherty, is a 40 y.o. male  ZOX:[id number]  [id number]  DOB - 1976-05-30  Subjective:  Chief Complaint and HPI: Michael Daugherty is a 40 y.o. male here today for 5 day history of nasal congestion, ST, cough, subjective fever(no thermometer).  Mucus has been clear.  No body aches. No abdominal pain/N/V. He hasn't tried any OTCs. His symptoms are beginning to improve.   Also needs refills for pantoprazole for reflux and miralax for constipation.     ROS:   Constitutional:  Subjective fever; no chills, No night sweats, No unexplained weight loss. EENT:  No vision changes, No blurry vision, No hearing changes. +ST and ear congestion.  Respiratory: some cough, No SOB Cardiac: No CP, no palpitations GI:  No abd pain, No N/V/D. GU: No Urinary s/sx Musculoskeletal: No joint pain Neuro: No headache, no dizziness, no motor weakness.  Skin: No rash Endocrine:  No polydipsia. No polyuria.  Psych: Denies SI/HI  No problems updated.  ALLERGIES: Allergies  Allergen Reactions  . Other Hives    Pecan nuts  . Oysters [Shellfish Allergy] Hives  . Soybean-Containing Drug Products Hives  . Strawberry Extract Hives    PAST MEDICAL HISTORY: Past Medical History:  Diagnosis Date  . Attention deficit disorder (ADD)   . Seasonal allergies     MEDICATIONS AT HOME: Prior to Admission medications   Medication Sig Start Date End Date Taking? Authorizing Provider  cetirizine (ZYRTEC) 10 MG tablet Take 1 tablet (10 mg total) by mouth daily. For allergies 05/31/16  Yes Anders Simmonds, PA-C  hydrOXYzine (ATARAX/VISTARIL) 25 MG tablet Take 1 tablet (25 mg total) by mouth every 6 (six) hours as needed for anxiety. 01/25/16  Yes Sanjuana Kava, NP  pantoprazole (PROTONIX) 40 MG tablet Take 1 tablet (40 mg total) by mouth daily. For acid reflux 05/31/16  Yes Marzella Schlein Kadan Millstein, PA-C  sodium chloride (OCEAN) 0.65 % SOLN nasal spray Place 1 spray into both nostrils as needed for congestion.  01/25/16  Yes Sanjuana Kava, NP  benztropine (COGENTIN) 0.5 MG tablet Take 1 tablet (0.5 mg total) by mouth at bedtime. For prevention of drug induced tremors 01/25/16   Sanjuana Kava, NP  divalproex (DEPAKOTE ER) 500 MG 24 hr tablet Take 1 tablet (500 mg total) by mouth at bedtime. For mood stabilization 01/25/16   Sanjuana Kava, NP  haloperidol (HALDOL) 2 MG tablet Take 1 tablet (2 mg total) by mouth at bedtime. For mood control 01/25/16   Sanjuana Kava, NP  polyethylene glycol powder (GLYCOLAX/MIRALAX) powder Take 17 g by mouth 2 (two) times daily as needed. 05/31/16   Anders Simmonds, PA-C  traZODone (DESYREL) 50 MG tablet Take 1 tablet (50 mg total) by mouth at bedtime as needed for sleep. Patient not taking: Reported on 05/31/2016 01/25/16   Sanjuana Kava, NP     Objective:  EXAM:   Vitals:   05/31/16 1425  BP: (!) 146/83  Pulse: 70  Resp: 16  Temp: 98.2 F (36.8 C)  TempSrc: Oral  SpO2: 100%  Weight: 190 lb 12.8 oz (86.5 kg)    General appearance : A&OX3. NAD. Non-toxic-appearing HEENT: Atraumatic and Normocephalic.  PERRLA. EOM intact.  TM with fluid B but no eythema.  Some cerumen in canal but not obstructing.  Mouth-MMM, post pharynx WNL w/o erythema, +clear PND. Neck: supple, no JVD. No cervical lymphadenopathy. No thyromegaly Chest/Lungs:  Breathing-non-labored, Good air entry bilaterally, breath sounds normal without rales, rhonchi, or wheezing  CVS: S1 S2 regular, no murmurs, gallops, rubs  Extremities: Bilateral Lower Ext shows no edema, both legs are warm to touch with = pulse throughout Neurology:  CN II-XII grossly intact, Non focal.   Psych:   Appropriate eye contact and affect.  TP scattered.  Interrupts frequently, but pleasant and cooperative.   Skin:  No Rash  Data Review Lab Results  Component Value Date   HGBA1C 5.5 01/21/2016     Assessment & Plan   1. Acute upper respiratory infection Fluids, rest, respiratory care.  Defer flu shot today since he may  have had fever with illness. OTC meds for s/sx.  Get thermometer to assess for fever.  RTC if worsening and not continued resolution of symptoms.   2. Dyspepsia - pantoprazole (PROTONIX) 40 MG tablet; Take 1 tablet (40 mg total) by mouth daily. For acid reflux  Dispense: 30 tablet; Refill: 2  3. Environmental allergies - cetirizine (ZYRTEC) 10 MG tablet; Take 1 tablet (10 mg total) by mouth daily. For allergies  Dispense: 90 tablet; Refill: 2  4. Other constipation - polyethylene glycol powder (GLYCOLAX/MIRALAX) powder; Take 17 g by mouth 2 (two) times daily as needed.  Dispense: 3350 g; Refill: 1  5. Nasal congestion Phenylephrine OTC per package prn - cetirizine (ZYRTEC) 10 MG tablet; Take 1 tablet (10 mg total) by mouth daily. For allergies  Dispense: 90 tablet; Refill: 2     Patient have been counseled extensively about nutrition and exercise  Return in about 3 months (around 08/31/2016) for assign to PCP.  The patient was given clear instructions to go to ER or return to medical center if symptoms don't improve, worsen or new problems develop. The patient verbalized understanding. The patient was told to call to get lab results if they haven't heard anything in the next week.     Georgian Co, PA-C [facility] [place], [place] [phone]   05/31/2016, 2:47 PM

## 2016-06-13 ENCOUNTER — Encounter: Payer: Self-pay | Admitting: *Deleted

## 2016-07-03 MED FILL — ?PANTOPRAZOLE SOD DR 40MG: 40 MG | 30 days supply | Qty: 30 | Fill #4

## 2016-07-17 ENCOUNTER — Telehealth: Payer: Self-pay | Admitting: Family Medicine

## 2016-07-17 DIAGNOSIS — K0889 Other specified disorders of teeth and supporting structures: Secondary | ICD-10-CM

## 2016-07-17 NOTE — Telephone Encounter (Signed)
Patient is needing a referral for dentist. Please follow up.

## 2016-07-18 NOTE — Telephone Encounter (Signed)
Will route to PCP 

## 2016-07-20 ENCOUNTER — Ambulatory Visit: Payer: Self-pay

## 2016-07-25 ENCOUNTER — Ambulatory Visit: Payer: Self-pay | Attending: Family Medicine | Admitting: Family Medicine

## 2016-07-25 ENCOUNTER — Encounter: Payer: Self-pay | Admitting: Family Medicine

## 2016-07-25 VITALS — BP 151/77 | HR 76 | Temp 98.0°F | Resp 20 | Ht 67.0 in | Wt 190.0 lb

## 2016-07-25 DIAGNOSIS — F19929 Other psychoactive substance use, unspecified with intoxication, unspecified: Secondary | ICD-10-CM

## 2016-07-25 DIAGNOSIS — F19980 Other psychoactive substance use, unspecified with psychoactive substance-induced anxiety disorder: Secondary | ICD-10-CM | POA: Insufficient documentation

## 2016-07-25 DIAGNOSIS — Z79899 Other long term (current) drug therapy: Secondary | ICD-10-CM | POA: Insufficient documentation

## 2016-07-25 DIAGNOSIS — K0889 Other specified disorders of teeth and supporting structures: Secondary | ICD-10-CM | POA: Insufficient documentation

## 2016-07-25 MED ORDER — SALINE SPRAY 0.65 % NA SOLN: 1.0000 | NASAL | 3 refills | Status: DC | PRN

## 2016-07-25 MED ORDER — NAPROXEN 500 MG PO TABS: 500.0000 mg | ORAL_TABLET | Freq: Two times a day (BID) | ORAL | 0 refills | Status: DC | PRN

## 2016-07-25 MED ORDER — ACETAMINOPHEN-CODEINE #3 300-30 MG PO TABS: 1.0000 | ORAL_TABLET | Freq: Three times a day (TID) | ORAL | 0 refills | Status: DC | PRN

## 2016-07-25 MED ORDER — AMOXICILLIN 500 MG PO CAPS: 500.0000 mg | ORAL_CAPSULE | Freq: Three times a day (TID) | ORAL | 0 refills | Status: DC

## 2016-07-25 MED FILL — AMOXICILLIN 500 MG CAPSULE: 500 | 10 days supply | Qty: 30 | Fill #0

## 2016-07-25 MED FILL — NAPROXEN 500 MG TABLET: 500 | 15 days supply | Qty: 30 | Fill #0

## 2016-07-25 MED FILL — ?PANTOPRAZOLE SOD DR 40MG: 40 MG | 30 days supply | Qty: 30 | Fill #5

## 2016-07-25 MED FILL — ACETAMINOPHEN/COD #3 TABLET: 300-30 | 6 days supply | Qty: 20 | Fill #0

## 2016-07-25 NOTE — Patient Instructions (Addendum)
Michael Daugherty was seen today for dental pain.  Diagnoses and all orders for this visit:  Pain, dental -     amoxicillin (AMOXIL) 500 MG capsule; Take 1 capsule (500 mg total) by mouth 3 (three) times daily. -     naproxen (NAPROSYN) 500 MG tablet; Take 1 tablet (500 mg total) by mouth 2 (two) times daily as needed for moderate pain. Take with food -     acetaminophen-codeine (TYLENOL #3) 300-30 MG tablet; Take 1 tablet by mouth every 8 (eight) hours as needed.  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 I [facility] that [facility] to help determine if you still need haldol, depakote, cogentin and trazodone.  F/u in 6-8 weeks for BP recheck, last 2 reading high, 10/10 dental pain today  Dr. Armen PickupFunches

## 2016-07-25 NOTE — Progress Notes (Signed)
Subjective:  Patient ID: Michael Daugherty, male    DOB: 1975/11/29  Age: 40 y.o. MRN: [id number]  CC: Dental Pain   HPI Michael Daugherty has hx of substance abuse mood disorder ( alcohol and marijuana, hospitalized at [facility] in 01/2013), childhood seizure disorder, he   presents for   1. Dental pain: x 3 weeks. No oral swelling or fever. Has chronically poor dentition. Request pain medicine, specifically oxycodone or Vicodin. Has tried tylenol OTC without relief of pain.   2. Mood disorder: requesting refill of cogentin, Depakote, trazodone which were prescribed along with haldol following his behavioral health admission. Not under the care of mental health at the time. Denies seizures. Denies SI. Denies substance abuse.    Past Medical History:  Diagnosis Date  . Attention deficit disorder (ADD)   . Seasonal allergies    Outpatient Medications Prior to Visit  Medication Sig Dispense Refill  . cetirizine (ZYRTEC) 10 MG tablet Take 1 tablet (10 mg total) by mouth daily. For allergies 90 tablet 2  . pantoprazole (PROTONIX) 40 MG tablet Take 1 tablet (40 mg total) by mouth daily. For acid reflux 30 tablet 2  . polyethylene glycol powder (GLYCOLAX/MIRALAX) powder Take 17 g by mouth 2 (two) times daily as needed. 3350 g 1  . benztropine (COGENTIN) 0.5 MG tablet Take 1 tablet (0.5 mg total) by mouth at bedtime. For prevention of drug induced tremors (Patient not taking: Reported on 07/25/2016) 30 tablet 0  . divalproex (DEPAKOTE ER) 500 MG 24 hr tablet Take 1 tablet (500 mg total) by mouth at bedtime. For mood stabilization (Patient not taking: Reported on 07/25/2016) 30 tablet 0  . haloperidol (HALDOL) 2 MG tablet Take 1 tablet (2 mg total) by mouth at bedtime. For mood control 30 tablet 0  . hydrOXYzine (ATARAX/VISTARIL) 25 MG tablet Take 1 tablet (25 mg total) by mouth every 6 (six) hours as needed for anxiety. 60 tablet 0  . sodium chloride (OCEAN) 0.65 % SOLN nasal spray Place  1 spray into both nostrils as needed for congestion. (Patient not taking: Reported on 07/25/2016)  0  . traZODone (DESYREL) 50 MG tablet Take 1 tablet (50 mg total) by mouth at bedtime as needed for sleep. (Patient not taking: Reported on 07/25/2016) 30 tablet 0   No facility-administered medications prior to visit.     ROS Review of Systems  Constitutional: Negative for chills, fatigue, fever and unexpected weight change.  HENT: Positive for dental problem.   Eyes: Negative for visual disturbance.  Respiratory: Negative for cough and shortness of breath.   Cardiovascular: Negative for chest pain, palpitations and leg swelling.  Gastrointestinal: Negative for abdominal pain, blood in stool, constipation, diarrhea, nausea and vomiting.  Endocrine: Negative for polydipsia, polyphagia and polyuria.  Musculoskeletal: Negative for arthralgias, back pain, gait problem, myalgias and neck pain.  Skin: Negative for rash.  Allergic/Immunologic: Negative for immunocompromised state.  Hematological: Negative for adenopathy. Does not bruise/bleed easily.  Psychiatric/Behavioral: Negative for dysphoric mood, sleep disturbance and suicidal ideas. The patient is not nervous/anxious.     Objective:  BP (!) 151/77   Pulse 76   Temp 98 F (36.7 C) (Oral)   Resp 20   Ht 5\' 7"  (1.702 m)   Wt 190 lb (86.2 kg)   SpO2 97%   BMI 29.76 kg/m   BP/Weight 07/25/2016 05/31/2016 01/20/2016  Systolic BP 151 146 125  Diastolic BP 77 83 80  Wt. (Lbs) 190 190.8 -  BMI 29.76 29.88 -  Some encounter information is confidential and restricted. Go to Review Flowsheets activity to see all data.   Physical Exam  Constitutional: He appears well-developed and well-nourished. No distress.  HENT:  Head: Normocephalic and atraumatic.  Mouth/Throat: Oropharynx is clear and moist. Abnormal dentition. Dental caries present. No dental abscesses.    Neck: Normal range of motion. Neck supple.  Cardiovascular: Normal  rate, regular rhythm, normal heart sounds and intact distal pulses.   Pulmonary/Chest: Effort normal and breath sounds normal.  Musculoskeletal: He exhibits no edema.  Neurological: He is alert.  Skin: Skin is warm and dry. No rash noted. No erythema.  Psychiatric: He has a normal mood and affect.   Depression screen [facility] 2/9 07/25/2016 05/31/2016 10/19/2015  Decreased Interest 1 3 0  Down, Depressed, Hopeless 1 3 0  PHQ - 2 Score 2 6 0  Altered sleeping 1 3 -  Tired, decreased energy 1 3 -  Change in appetite 0 3 -  Feeling bad or failure about yourself  1 3 -  Trouble concentrating 0 0 -  Moving slowly or fidgety/restless 0 3 -  Suicidal thoughts 0 0 -  PHQ-9 Score 5 21 -  Difficult doing work/chores Not difficult at all - -   GAD 7 : Generalized Anxiety Score 07/25/2016 05/31/2016  Nervous, Anxious, on Edge 1 3  Control/stop worrying 1 3  Worry too much - different things 1 3  Trouble relaxing 1 3  Restless 1 3  Easily annoyed or irritable 0 3  Afraid - awful might happen 1 3  Total GAD 7 Score 6 21      Assessment & Plan:  Michael Daugherty was seen today for dental pain.  Diagnoses and all orders for this visit:  Pain, dental -     amoxicillin (AMOXIL) 500 MG capsule; Take 1 capsule (500 mg total) by mouth 3 (three) times daily. -     naproxen (NAPROSYN) 500 MG tablet; Take 1 tablet (500 mg total) by mouth 2 (two) times daily as needed for moderate pain. Take with food -     acetaminophen-codeine (TYLENOL #3) 300-30 MG tablet; Take 1 tablet by mouth every 8 (eight) hours as needed.  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  No orders of the defined types were placed in this encounter.   Follow-up: Return in about 6 weeks (around 09/05/2016) for BP check .   Dessa Phi MD

## 2016-07-25 NOTE — Assessment & Plan Note (Signed)
I have referred you to psychiatry. I [facility] to help determine if you still need haldol, depakote, cogentin and trazodone.

## 2016-07-25 NOTE — Assessment & Plan Note (Signed)
Dental pain from chronic poor dentition No abscess at this time Patient has dental appointment on 09/06/16 Has severe pain, reports 10/10  P: amoxilicin course NSAID, tylenol #3 short course

## 2016-07-25 NOTE — Progress Notes (Signed)
C/o tooth pain x 3weeks. Been taking Extra strength tylenol not helping. Has appt with dentist November 30th. Refill: Cogentin, Depakote, Saline nasal spray, Trazadone.

## 2016-08-20 MED FILL — ?PANTOPRAZOLE SOD DR 40MG: 40 MG | 30 days supply | Qty: 30 | Fill #6

## 2016-08-22 ENCOUNTER — Ambulatory Visit (INDEPENDENT_AMBULATORY_CARE_PROVIDER_SITE_OTHER): Payer: Self-pay | Admitting: Gastroenterology

## 2016-08-22 ENCOUNTER — Encounter: Payer: Self-pay | Admitting: Gastroenterology

## 2016-08-22 VITALS — BP 112/78 | HR 84 | Ht 66.5 in | Wt 192.4 lb

## 2016-08-22 DIAGNOSIS — K5900 Constipation, unspecified: Secondary | ICD-10-CM

## 2016-08-22 DIAGNOSIS — K219 Gastro-esophageal reflux disease without esophagitis: Secondary | ICD-10-CM

## 2016-08-22 NOTE — Progress Notes (Signed)
Michael Daugherty    [id number]    May 20, 1976  Primary Care Physician:[name], Ellison Carwin, MD  Referring Physician: Dessa Phi, MD [address] [place], [place] [address]  Chief complaint:  Constipation, GERD HPI: 40 year old male here for follow-up visit, he was last seen on 12/21/2015. She reports significant improvement of GERD symptoms with Protonix once daily. He is currently taking MiraLAX 1 capful twice daily and reports having intermittent loose bowel movements. He is feeling much better overall with decreased bloating. 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 Denies any nausea, vomiting, abdominal pain, melena or bright red blood per rectum   Outpatient Encounter Prescriptions as of 08/22/2016  Medication Sig  . cetirizine (ZYRTEC) 10 MG tablet Take 1 tablet (10 mg total) by mouth daily. For allergies  . pantoprazole (PROTONIX) 40 MG tablet Take 1 tablet (40 mg total) by mouth daily. For acid reflux  . polyethylene glycol powder (GLYCOLAX/MIRALAX) powder Take 17 g by mouth 2 (two) times daily as needed.  [name] acetaminophen-codeine (TYLENOL #3) 300-30 MG tablet Take 1 tablet by mouth every 8 (eight) hours as needed. (Patient not taking: Reported on 08/22/2016)  . benztropine (COGENTIN) 0.5 MG tablet Take 1 tablet (0.5 mg total) by mouth at bedtime. For prevention of drug induced tremors (Patient not taking: Reported on 08/22/2016)  . divalproex (DEPAKOTE ER) 500 MG 24 hr tablet Take 1 tablet (500 mg total) by mouth at bedtime. For mood stabilization (Patient not taking: Reported on 08/22/2016)  . haloperidol (HALDOL) 2 MG tablet Take 1 tablet (2 mg total) by mouth at bedtime. For mood control (Patient not taking: Reported on 08/22/2016)  . hydrOXYzine (ATARAX/VISTARIL) 25 MG tablet Take 1 tablet (25 mg total) by mouth every 6 (six) hours as needed for anxiety. (Patient not taking: Reported on 08/22/2016)  .  naproxen (NAPROSYN) 500 MG tablet Take 1 tablet (500 mg total) by mouth 2 (two) times daily as needed for moderate pain. Take with food (Patient not taking: Reported on 08/22/2016)  . sodium chloride (OCEAN) 0.65 % SOLN nasal spray Place 1 spray into both nostrils as needed for congestion. (Patient not taking: Reported on 08/22/2016)  . traZODone (DESYREL) 50 MG tablet Take 1 tablet (50 mg total) by mouth at bedtime as needed for sleep. (Patient not taking: Reported on 08/22/2016)  . [DISCONTINUED] amoxicillin (AMOXIL) 500 MG capsule Take 1 capsule (500 mg total) by mouth 3 (three) times daily.   No facility-administered encounter medications on file as of 08/22/2016.     Allergies as of 08/22/2016 - Review Complete 08/22/2016  Allergen Reaction Noted  . Other Hives 11/28/2013  . Oysters [shellfish allergy] Hives 11/28/2013  . Soybean-containing drug products Hives 11/28/2013  . Strawberry extract Hives 11/28/2013    Past Medical History:  Diagnosis Date  . Attention deficit disorder (ADD)   . Seasonal allergies     Past Surgical History:  Procedure Laterality Date  . RHINOPLASTY      Family History  Problem Relation Age of Onset  . Diabetes Mother   . Stomach cancer Mother   . Stomach cancer Father   . Diabetes Brother   . Stomach cancer Brother   . Mental illness Neg Hx     Social History   Social History  . Marital status: Single    Spouse name: N/A  . Number of children: 0  .  Years of education: N/A   Occupational History  . Not on file.   Social History Main Topics  . Smoking status: Former Games developer  . Smokeless tobacco: Never Used  . Alcohol use No  . Drug use: No  . Sexual activity: Not on file   Other Topics Concern  . Not on file   Social History Narrative  . No narrative on file      Review of systems: Review of Systems  Constitutional: Negative for fever and chills.  HENT: Negative.   Eyes: Negative for blurred vision.  Respiratory:  Negative for cough, shortness of breath and wheezing.   Cardiovascular: Negative for chest pain and palpitations.  Gastrointestinal: as per HPI Genitourinary: Negative for dysuria, urgency, frequency and hematuria.  Musculoskeletal: Negative for myalgias, back pain and joint pain.  Skin: Negative for itching and rash.  Neurological: Negative for dizziness, tremors, focal weakness, seizures and loss of consciousness.  Endo/Heme/Allergies: Positive for seasonal allergies.  Psychiatric/Behavioral: Negative for depression, suicidal ideas and hallucinations.  All other systems reviewed and are negative.   Physical Exam: Vitals:   08/22/16 0855  BP: 112/78  Pulse: 84   Body mass index is 30.58 kg/m. Gen:      No acute distress HEENT:  EOMI, sclera anicteric Neck:     No masses; no thyromegaly Lungs:    Clear to auscultation bilaterally; normal respiratory effort CV:         Regular rate and rhythm; no murmurs Abd:      + bowel sounds; soft, non-tender; no palpable masses, no distension Ext:    No edema; adequate peripheral perfusion Skin:      Warm and dry; no rash Neuro: alert and oriented x 3 Psych: normal mood and affect  Data Reviewed:  Reviewed labs, radiology imaging, old records and pertinent past GI work up   Assessment and Plan/Recommendations: 40 year old male here for follow-up of chronic constipation and GERD  Constipation improved with MiraLAX He is having intermittent loose bowel movements, advised patient to titrate down dose and take only half a capful twice daily and adjust the dose based on response Increase dietary fiber and fluid intake  GERD: continue Protonix daily Anti reflux measures  Patient brought in his copy of EGD and colonoscopy report from 2015, biopsies were taken and were unremarkable exams. Will request pathology report Return in 12 months or sooner if needed    K. Scherry Ran , MD [phone] Mon-Fri 8a-5p [phone] after 5p, weekends,  holidays  CC: Dessa Phi, MD

## 2016-08-22 NOTE — Patient Instructions (Signed)
Follow up in 1 year  Use Miralax 1/2 capful daily  Continue Protonix

## 2016-08-24 ENCOUNTER — Ambulatory Visit: Payer: Self-pay | Attending: Family Medicine

## 2016-09-20 MED FILL — ?PANTOPRAZOLE SOD DR 40MG: 40 MG | 30 days supply | Qty: 30 | Fill #7

## 2016-10-14 ENCOUNTER — Encounter (HOSPITAL_COMMUNITY): Payer: Self-pay

## 2016-10-14 ENCOUNTER — Emergency Department (HOSPITAL_COMMUNITY)
Admission: EM | Admit: 2016-10-14 | Discharge: 2016-10-14 | Disposition: A | Discharge status: Home / Self Care | Payer: Self-pay | Attending: Emergency Medicine | Admitting: Emergency Medicine

## 2016-10-14 DIAGNOSIS — Z79899 Other long term (current) drug therapy: Secondary | ICD-10-CM | POA: Insufficient documentation

## 2016-10-14 DIAGNOSIS — Z87891 Personal history of nicotine dependence: Secondary | ICD-10-CM | POA: Insufficient documentation

## 2016-10-14 DIAGNOSIS — K0889 Other specified disorders of teeth and supporting structures: Secondary | ICD-10-CM | POA: Insufficient documentation

## 2016-10-14 DIAGNOSIS — F909 Attention-deficit hyperactivity disorder, unspecified type: Secondary | ICD-10-CM | POA: Insufficient documentation

## 2016-10-14 MED ORDER — NAPROXEN 500 MG PO TABS: 500.0000 mg | ORAL_TABLET | Freq: Two times a day (BID) | ORAL | 0 refills | Status: DC

## 2016-10-14 MED ORDER — LIDOCAINE VISCOUS 2 % MT SOLN: 15.0000 mL | OROMUCOSAL | 2 refills | Status: DC | PRN

## 2016-10-14 NOTE — ED Notes (Signed)
Declined W/C at D/C and was escorted to lobby by RN. 

## 2016-10-14 NOTE — Discharge Instructions (Signed)
You have been seen today for dental pain. You should follow up with your dentist as soon as possible. Use ibuprofen or naproxen for pain. You may take up to 500 mg of naproxen every 12 hours or 800 mg of ibuprofen every 8 hours. Take one or the other, but not both. Take these medications with food to avoid upset stomach. Use the viscous lidocaine for mouth pain. Swish with the lidocaine and spit it out. Do not swallow it.

## 2016-10-14 NOTE — ED Triage Notes (Signed)
Patient complains of gum pain since having tooth extracted on 12/29. States that he has been taking ibuprofen with no relief. Here requesting pain control

## 2016-10-14 NOTE — ED Triage Notes (Signed)
PT reports pain at site of a tooth extraction that was done apprx. 4 days ago. Pt followed up with his dentist and has been taking advil and tylenol for pain . Pt reports pain has not improved.

## 2016-10-14 NOTE — ED Provider Notes (Signed)
[facility]-EMERGENCY DEPT Provider Note   CSN: [id number] Arrival date & time: 10/14/16  1148  By signing my name below, I, Orpah Cobb, attest that this documentation has been prepared under the direction and in the presence of Shawn Joy, PA-C. Electronically Signed: Orpah Cobb , ED Scribe. 10/14/16. 4:53 PM.   History   Chief Complaint Chief Complaint  Patient presents with  . Dental Problem    HPI Comments: Michael Daugherty is a 41 y.o. male who presents to the Emergency Department complaining of worsening, mild to moderate dental pain following a tooth extraction that occurred on 12/29. Patient states that he was told to take ibuprofen by the dentist. This gave him minimal relief. Patient called the dentist again and was then told to alternate Tylenol and ibuprofen. Patient states he has done this, but still has pain. Denies fever/chills, nausea/vomiting, difficulty breathing or swallowing, drainage from the extraction site, or any other complaints.    HPI  Past Medical History:  Diagnosis Date  . Attention deficit disorder (ADD)   . Seasonal allergies     Patient Active Problem List   Diagnosis Date Noted  . Pain, dental 07/25/2016  . 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 Cannabis use disorder, moderate, dependence (HCC) 01/23/2016  . Alcohol use disorder, mild, abuse 01/23/2016  . Hyperlipidemia 01/23/2016  . Hx of seizure disorder 01/23/2016  . Intermittent explosive disorder 01/20/2016  . Constipation 10/19/2015  . Environmental allergies 08/24/2014  . Excess ear wax 08/24/2014  . Dyspepsia 08/24/2014  . Nasal congestion 08/24/2014    Past Surgical History:  Procedure Laterality Date  . RHINOPLASTY         Home Medications    Prior to Admission medications   Medication Sig Start Date End Date Taking? Authorizing Provider  acetaminophen-codeine (TYLENOL #3) 300-30 MG tablet Take 1 tablet  by mouth every 8 (eight) hours as needed. Patient not taking: Reported on 08/22/2016 07/25/16   Dessa Phi, MD  benztropine (COGENTIN) 0.5 MG tablet Take 1 tablet (0.5 mg total) by mouth at bedtime. For prevention of drug induced tremors Patient not taking: Reported on 08/22/2016 01/25/16   Sanjuana Kava, NP  cetirizine (ZYRTEC) 10 MG tablet Take 1 tablet (10 mg total) by mouth daily. For allergies 05/31/16   Anders Simmonds, PA-C  divalproex (DEPAKOTE ER) 500 MG 24 hr tablet Take 1 tablet (500 mg total) by mouth at bedtime. For mood stabilization Patient not taking: Reported on 08/22/2016 01/25/16   Sanjuana Kava, NP  haloperidol (HALDOL) 2 MG tablet Take 1 tablet (2 mg total) by mouth at bedtime. For mood control Patient not taking: Reported on 08/22/2016 01/25/16   Sanjuana Kava, NP  hydrOXYzine (ATARAX/VISTARIL) 25 MG tablet Take 1 tablet (25 mg total) by mouth every 6 (six) hours as needed for anxiety. Patient not taking: Reported on 08/22/2016 01/25/16   Sanjuana Kava, NP  lidocaine (XYLOCAINE) 2 % solution Use as directed 15 mLs in the mouth or throat as needed for mouth pain. 10/14/16   Shawn C Joy, PA-C  naproxen (NAPROSYN) 500 MG tablet Take 1 tablet (500 mg total) by mouth 2 (two) times daily as needed for moderate pain. Take with food Patient not taking: Reported on 08/22/2016 07/25/16   Dessa Phi, MD  naproxen (NAPROSYN) 500 MG tablet Take 1 tablet (500 mg total) by mouth 2 (two) times daily. 10/14/16   Shawn C Joy, PA-C  pantoprazole (PROTONIX) 40 MG  tablet Take 1 tablet (40 mg total) by mouth daily. For acid reflux 05/31/16   Marzella Schlein McClung, PA-C  polyethylene glycol powder (GLYCOLAX/MIRALAX) powder Take 17 g by mouth 2 (two) times daily as needed. 05/31/16   Anders Simmonds, PA-C  sodium chloride (OCEAN) 0.65 % SOLN nasal spray Place 1 spray into both nostrils as needed for congestion. Patient not taking: Reported on 08/22/2016 07/25/16   Dessa Phi, MD  traZODone  (DESYREL) 50 MG tablet Take 1 tablet (50 mg total) by mouth at bedtime as needed for sleep. Patient not taking: Reported on 08/22/2016 01/25/16   Sanjuana Kava, NP    Family History Family History  Problem Relation Age of Onset  . Diabetes Mother   . Stomach cancer Mother   . Stomach cancer Father   . Diabetes Brother   . Stomach cancer Brother   . Mental illness Neg Hx     Social History Social History  Substance Use Topics  . Smoking status: Former Games developer  . Smokeless tobacco: Never Used  . Alcohol use No     Allergies   Other; Oysters [shellfish allergy]; Soybean-containing drug products; and Strawberry extract   Review of Systems Review of Systems  Constitutional: Negative for chills and fever.  HENT: Positive for dental problem. Negative for facial swelling.   Respiratory: Negative for shortness of breath.      Physical Exam Updated Vital Signs BP 154/98 (BP Location: Right Arm)   Pulse 66   Temp 98.3 F (36.8 C) (Oral)   Resp 17   SpO2 100%   Physical Exam  Constitutional: He appears well-developed and well-nourished. No distress.  HENT:  Head: Normocephalic and atraumatic.  Extraction site to the right maxillary gingiva in the region of the premolars. Appears to be healing well, no discharge or active bleeding, no trismus, handles oral secretions without difficulty.  Eyes: Conjunctivae are normal.  Neck: Normal range of motion. Neck supple.  No swelling to the soft tissues of the neck.  Cardiovascular: Normal rate and regular rhythm.   Pulmonary/Chest: Effort normal. No respiratory distress.  Speaks in full sentences without difficulty.  Lymphadenopathy:    He has no cervical adenopathy.  Neurological: He is alert.  Skin: Skin is warm and dry. He is not diaphoretic.  Psychiatric: He has a normal mood and affect. His behavior is normal.  Nursing note and vitals reviewed.    ED Treatments / Results   DIAGNOSTIC STUDIES: Oxygen Saturation is 100%  on RA, normal by my interpretation.   COORDINATION OF CARE: 4:53 PM-Discussed next steps with pt. Pt verbalized understanding and is agreeable with the plan.    Labs (all labs ordered are listed, but only abnormal results are displayed) Labs Reviewed - No data to display  EKG  EKG Interpretation None       Radiology No results found.  Procedures Procedures (including critical care time)  Medications Ordered in ED Medications - No data to display   Initial Impression / Assessment and Plan / ED Course  I have reviewed the triage vital signs and the nursing notes.  Pertinent labs & imaging results that were available during my care of the patient were reviewed by me and considered in my medical decision making (see chart for details).  Clinical Course     Patient presents with continued dental pain. No signs of sepsis or Ludwig's angioedema. Recommend follow-up with his dentist. Instructions for home pain management as well as return precautions discussed.  Final Clinical Impressions(s) / ED Diagnoses   Final diagnoses:  Dentalgia    New Prescriptions Discharge Medication List as of 10/14/2016  1:17 PM    START taking these medications   Details  lidocaine (XYLOCAINE) 2 % solution Use as directed 15 mLs in the mouth or throat as needed for mouth pain., Starting Sun 10/14/2016, Print    !! naproxen (NAPROSYN) 500 MG tablet Take 1 tablet (500 mg total) by mouth 2 (two) times daily., Starting Sun 10/14/2016, Print     !! - Potential duplicate medications found. Please discuss with provider.     I personally performed the services described in this documentation, which was scribed in my presence. The recorded information has been reviewed and is accurate.    Anselm PancoastShawn C Joy, PA-C 10/14/16 1654    Loren Raceravid Yelverton, MD 10/27/16 1130

## 2016-10-23 MED FILL — ?PANTOPRAZOLE SOD DR 40MG: 40 MG | 30 days supply | Qty: 30 | Fill #8

## 2016-10-31 ENCOUNTER — Ambulatory Visit: Payer: Self-pay | Attending: Family Medicine | Admitting: Physician Assistant

## 2016-10-31 ENCOUNTER — Encounter: Payer: Self-pay | Admitting: Licensed Clinical Social Worker

## 2016-10-31 ENCOUNTER — Encounter: Payer: Self-pay | Admitting: Physician Assistant

## 2016-10-31 VITALS — BP 132/78 | HR 68 | Temp 98.1°F | Resp 18 | Ht 66.0 in | Wt 191.4 lb

## 2016-10-31 DIAGNOSIS — R609 Edema, unspecified: Secondary | ICD-10-CM | POA: Insufficient documentation

## 2016-10-31 DIAGNOSIS — K047 Periapical abscess without sinus: Secondary | ICD-10-CM | POA: Insufficient documentation

## 2016-10-31 DIAGNOSIS — Z91013 Allergy to seafood: Secondary | ICD-10-CM | POA: Insufficient documentation

## 2016-10-31 DIAGNOSIS — R52 Pain, unspecified: Secondary | ICD-10-CM | POA: Insufficient documentation

## 2016-10-31 DIAGNOSIS — Z79899 Other long term (current) drug therapy: Secondary | ICD-10-CM | POA: Insufficient documentation

## 2016-10-31 MED ORDER — NAPROXEN 500 MG PO TABS: 500.0000 mg | ORAL_TABLET | Freq: Two times a day (BID) | ORAL | 0 refills | Status: DC

## 2016-10-31 MED ORDER — CEFTRIAXONE SODIUM 1 G IJ SOLR
1.0000 g | Freq: Once | INTRAMUSCULAR | Status: AC
  Administered 2016-10-31: 1 g via INTRAMUSCULAR

## 2016-10-31 MED ORDER — PENICILLIN V POTASSIUM 500 MG PO TABS: 500.0000 mg | ORAL_TABLET | Freq: Three times a day (TID) | ORAL | 0 refills | Status: DC

## 2016-10-31 MED FILL — NAPROXEN 500 MG TABLET: 500 | 15 days supply | Qty: 30 | Fill #0

## 2016-10-31 MED FILL — ?PENICILLIN VK 500 MG TABLE: 500 | 10 days supply | Qty: 30 | Fill #0

## 2016-10-31 NOTE — Progress Notes (Signed)
Patient ID: Michael Daugherty, male   DOB: [birth year], 41 y.o.   MRN: [id number]   Michael Daugherty, is a 41 y.o. male  QDI:[id number]  MMH:[id number]  DOB - 03/31/1976  Subjective:  Chief Complaint and HPI: Michael Daugherty is a 41 y.o. male here today  c/o R facial/cheek swelling and pain since yesterday morning.  He had a tooth pulled ~3 weeks ago and has had pain on and off since.  He even went to the ED for pain on 10/14/2016.  The pain was much worse and the R side of his face was swollen when he awakened yesterday.  He hasn't eaten anything he is allergic to.  He denies fever.  He denies recent URI s/sx.  ED/Hospital notes reviewed.     ROS:   Constitutional:  No f/c, No night sweats, No unexplained weight loss. EENT:  No vision changes, No blurry vision, No hearing changes. No mouth, throat, or ear problems other than as stated above Respiratory: No cough, No SOB Cardiac: No CP, no palpitations GI:  No abd pain, No N/V/D. GU: No Urinary s/sx Musculoskeletal: No joint pain Neuro: No headache, no dizziness, no motor weakness.  Skin: No rash Endocrine:  No polydipsia. No polyuria.  Psych: Denies SI/HI  No problems updated.  ALLERGIES: Allergies  Allergen Reactions  . Other Hives    Pecan nuts  . Oysters [Shellfish Allergy] Hives  . Soybean-Containing Drug Products Hives  . Strawberry Extract Hives    PAST MEDICAL HISTORY: Past Medical History:  Diagnosis Date  . Attention deficit disorder (ADD)   . Seasonal allergies     MEDICATIONS AT HOME: Prior to Admission medications   Medication Sig Start Date End Date Taking? Authorizing Provider  acetaminophen-codeine (TYLENOL #3) 300-30 MG tablet Take 1 tablet by mouth every 8 (eight) hours as needed. Patient not taking: Reported on 08/22/2016 07/25/16   Boykin Nearing, MD  benztropine (COGENTIN) 0.5 MG tablet Take 1 tablet (0.5 mg total) by mouth at bedtime. For prevention of drug induced tremors Patient not taking:  Reported on 08/22/2016 01/25/16   Encarnacion Slates, NP  cetirizine (ZYRTEC) 10 MG tablet Take 1 tablet (10 mg total) by mouth daily. For allergies 05/31/16   Argentina Donovan, PA-C  divalproex (DEPAKOTE ER) 500 MG 24 hr tablet Take 1 tablet (500 mg total) by mouth at bedtime. For mood stabilization Patient not taking: Reported on 08/22/2016 01/25/16   Encarnacion Slates, NP  haloperidol (HALDOL) 2 MG tablet Take 1 tablet (2 mg total) by mouth at bedtime. For mood control Patient not taking: Reported on 08/22/2016 01/25/16   Encarnacion Slates, NP  hydrOXYzine (ATARAX/VISTARIL) 25 MG tablet Take 1 tablet (25 mg total) by mouth every 6 (six) hours as needed for anxiety. Patient not taking: Reported on 08/22/2016 01/25/16   Encarnacion Slates, NP  lidocaine (XYLOCAINE) 2 % solution Use as directed 15 mLs in the mouth or throat as needed for mouth pain. 10/14/16   Shawn C Joy, PA-C  naproxen (NAPROSYN) 500 MG tablet Take 1 tablet (500 mg total) by mouth 2 (two) times daily. 10/31/16   Argentina Donovan, PA-C  pantoprazole (PROTONIX) 40 MG tablet Take 1 tablet (40 mg total) by mouth daily. For acid reflux 05/31/16   Argentina Donovan, PA-C  penicillin v potassium (VEETID) 500 MG tablet Take 1 tablet (500 mg total) by mouth 3 (three) times daily. 10/31/16   Argentina Donovan, PA-C  polyethylene glycol powder (GLYCOLAX/MIRALAX) powder Take  17 g by mouth 2 (two) times daily as needed. 05/31/16   Argentina Donovan, PA-C  sodium chloride (OCEAN) 0.65 % SOLN nasal spray Place 1 spray into both nostrils as needed for congestion. Patient not taking: Reported on 08/22/2016 07/25/16   Boykin Nearing, MD  traZODone (DESYREL) 50 MG tablet Take 1 tablet (50 mg total) by mouth at bedtime as needed for sleep. Patient not taking: Reported on 08/22/2016 01/25/16   Encarnacion Slates, NP     Objective:  EXAM:   Vitals:   10/31/16 0941  BP: 132/78  Pulse: 68  Resp: 18  Temp: 98.1 F (36.7 C)  TempSrc: Oral  Weight: 191 lb 6.4 oz (86.8 kg)    Height: 5' 6"  (1.676 m)  PF: 100 L/min    General appearance : A&OX3. NAD. Non-toxic-appearing HEENT: Atraumatic and Normocephalic.  PERRLA. EOM intact.  TM clear B.  R maxillary area is swollen and TTP.  There is erythema where it appears tooth #5 was extracted.  There is swelling and TTP of the entire area with bony tenderness.  Mouth-MMM, post pharynx WNL w/o erythema, No PND. Neck: supple, no JVD. No cervical lymphadenopathy. No thyromegaly Chest/Lungs:  Breathing-non-labored, Good air entry bilaterally, breath sounds normal without rales, rhonchi, or wheezing  CVS: S1 S2 regular, no murmurs, gallops, rubs Extremities: Bilateral Lower Ext shows no edema, both legs are warm to touch with = pulse throughout Neurology:  CN II-XII grossly intact, Non focal.   Psych:  TP linear. J/I WNL. Normal speech. Appropriate eye contact and affect.  Skin:  No Rash  Data Review Lab Results  Component Value Date   HGBA1C 5.5 01/21/2016     Assessment & Plan   1. Tooth abscess Concer - penicillin v potassium (VEETID) 500 MG tablet; Take 1 tablet (500 mg total) by mouth 3 (three) times daily.  Dispense: 30 tablet; Refill: 0 - naproxen (NAPROSYN) 500 MG tablet; Take 1 tablet (500 mg total) by mouth 2 (two) times daily.  Dispense: 30 tablet; Refill: 0 - cefTRIAXone (ROCEPHIN) injection 1 g; Inject 1 g into the muscle once.  2. High scores on PHQ9 and GAD 7.  There are no acute safety issues.  He is alert, pleasant, cooperative, and has no SI/HI.  He met with Christa See the social worker. He plans to f/up with [facility].    Patient have been counseled extensively about nutrition and exercise  F/up prn.  To ED if worsens/fever.  The patient was given clear instructions to go to ER or return to medical center if symptoms don't improve, worsen or new problems develop. The patient verbalized understanding. The patient was told to call to get lab results if they haven't heard anything in the next  week.     Freeman Caldron, PA-C [facility] and [facility] [place], [place]   10/31/2016, 9:44 AM

## 2016-10-31 NOTE — Patient Instructions (Signed)
Call your dentist to see if they can get you in sooner than next week.

## 2016-10-31 NOTE — BH Specialist Note (Signed)
Session Start time: 10:15 AM   End Time: 10:45 AM Total Time:  30 minutes Type of Service: Behavioral Health - Individual/Family Interpreter: No.   Interpreter Name & Language: N/A # [facility]   SUBJECTIVE: Michael Daugherty is a 41 y.o. male  Pt. was referred by Michael PlattPA-C McClung for:  anxiety and depression. Pt. reports the following symptoms/concerns: overwhelming feelings of sadness and worry, difficulty sleeping, low energy, difficulty focusing, and irritability Duration of problem:  Ongoing Severity: severe Previous treatment: Pt has received psychotherapy and medication management with [facility] and [facility]. He is interested in re-initiating services   OBJECTIVE: Mood: Anxious & Affect: Appropriate Risk of harm to self or others: Pt denied SI/HI Assessments administered: PHQ-9; GAD-7  LIFE CONTEXT:  Family & Social: Pt resides with his mother. He has two brothers who reside out of state in [place] and [place]. He has a large extended family who resides locally and provides emotional support  School/ Work: Pt is currently unemployed as he was recently laid off from seasonal position with [employer]. Self-Care: Pt reports that he drinks alcohol "every other day" with friends. He no longer smokes marijuana or cigarettes due to negative effects on mental health  Life changes: Pt recently lost employment.  What is important to pt/family (values): Family, Spirituality, Independence   GOALS ADDRESSED:  Decrease symptoms of depression Decrease symptoms of anxiety  INTERVENTIONS: Solution Focused, Strength-based and Supportive   ASSESSMENT:  Pt currently experiencing depression and anxiety triggered by recent loss of employment. Pt reports overwhelming feelings of sadness and worry, difficulty sleeping, low energy, difficulty focusing, and irritability. He resides with mother and receives emotional and financial support from family. Pt may benefit from psychotherapy  and medication management. LCSWA discussed with pt how psychosocial stressors can negatively impact physical and mental health. LCSWA discussed benefits of applying healthy coping skills to decrease symptoms. Pt identified multiple strategies to utilize on a daily basis and was provided community resources to obtain stable employment. Pt verbalized interest in re-initiating therapy and medication management with [facility]. Resources for crisis intervention were provided.      PLAN: 1. F/U with behavioral health clinician: Pt was encouraged to contact LCSWA if symptoms worsen or fail to improve to [facility]. 2. Behavioral Health meds: Depakote, Haldol, Desyrel, and Vistaril 3. Behavioral recommendations: LCSWA recommends that pt apply healthy coping skills discussed and re-initiate behavioral health services with [facility]. Pt is encouraged to schedule follow up appointment with LCSWA 4. Referral: Brief Counseling/Psychotherapy, [facility], Problem-solving teaching/coping strategies, Psychoeducation and Supportive Counseling 5. From scale of 1-10, how likely are you to follow plan: 8/10   Michael LarssonJasmine D Kjirsten Daugherty, MSW, [facility]  Clinical Social Worker 11/02/16 2:22 PM  Warmhandoff:   Warm Hand Off Completed.

## 2016-11-22 ENCOUNTER — Ambulatory Visit: Payer: Self-pay | Attending: Family Medicine | Admitting: Physician Assistant

## 2016-11-22 VITALS — BP 132/83 | HR 72 | Temp 98.2°F | Resp 16 | Wt 192.4 lb

## 2016-11-22 DIAGNOSIS — H6123 Impacted cerumen, bilateral: Secondary | ICD-10-CM | POA: Insufficient documentation

## 2016-11-22 DIAGNOSIS — R1031 Right lower quadrant pain: Secondary | ICD-10-CM | POA: Insufficient documentation

## 2016-11-22 DIAGNOSIS — Z0001 Encounter for general adult medical examination with abnormal findings: Secondary | ICD-10-CM | POA: Insufficient documentation

## 2016-11-22 DIAGNOSIS — K219 Gastro-esophageal reflux disease without esophagitis: Secondary | ICD-10-CM | POA: Insufficient documentation

## 2016-11-22 DIAGNOSIS — Z79899 Other long term (current) drug therapy: Secondary | ICD-10-CM | POA: Insufficient documentation

## 2016-11-22 DIAGNOSIS — H9201 Otalgia, right ear: Secondary | ICD-10-CM

## 2016-11-22 MED ORDER — OMEPRAZOLE 20 MG PO CPDR: 20.0000 mg | DELAYED_RELEASE_CAPSULE | Freq: Two times a day (BID) | ORAL | 3 refills | Status: DC

## 2016-11-22 MED FILL — OMEPRAZOLE DR 20 MG CAPSULE: 20 | 30 days supply | Qty: 60 | Fill #0

## 2016-11-22 NOTE — Progress Notes (Signed)
Michael Daugherty, is a 41 y.o. male  ZOX:[id number]  WJX:[id number]  DOB - 1976-05-06  Subjective:  Chief Complaint and HPI: Michael Daugherty is a 40 y.o. male here with several issues:  He feels his protonix isn't working.  He also intermittently has constipation.  Saw GI in November and doesn't have f/up for 1 yr unless any problems. He has tried multiple regimens.  Admits to eating big meals and only drinking 1 bottle of water per day.   R ear and jaw pain on and off X 1 week.  No runny nose, cough or sinus s/sx.  Teeth seem to be doing ok.  He has his next dental procedures in the first week of March.  R groin pain intermittently.  No dysuria.  No penile discharge.  Not sexually active(hasn't been for a long time).  He has been doing some exercise which has been new for him.  +Sit-ups/push ups, etc at home.  No bulges, lumps,masses.  Not painful currently.  Social:  Currently not working but just had interview at [employer] and has another interview next week with [employer].   ROS:   Constitutional:  No f/c, No night sweats, No unexplained weight loss. EENT:  No vision changes, No blurry vision, No hearing changes. +R ear pain Respiratory: No cough, No SOB Cardiac: No CP, no palpitations GI:  No abd pain, No N/V/D. GU: No Urinary s/sx.  + R groin/testicle pain Musculoskeletal: No joint pain Neuro: No headache, no dizziness, no motor weakness.  Skin: No rash Endocrine:  No polydipsia. No polyuria.  Psych: Denies SI/HI  No problems updated.  ALLERGIES: Allergies  Allergen Reactions  . Other Hives    Pecan nuts  . Oysters [Shellfish Allergy] Hives  . Soybean-Containing Drug Products Hives  . Strawberry Extract Hives    PAST MEDICAL HISTORY: Past Medical History:  Diagnosis Date  . Attention deficit disorder (ADD)   . Seasonal allergies     MEDICATIONS AT HOME: Prior to Admission medications   Medication Sig Start Date End Date Taking?  Authorizing Provider  cetirizine (ZYRTEC) 10 MG tablet Take 1 tablet (10 mg total) by mouth daily. For allergies 05/31/16   Michael Simmonds, PA-C  divalproex (DEPAKOTE ER) 500 MG 24 hr tablet Take 1 tablet (500 mg total) by mouth at bedtime. For mood stabilization Patient not taking: Reported on 08/22/2016 01/25/16   Michael Kava, NP  haloperidol (HALDOL) 2 MG tablet Take 1 tablet (2 mg total) by mouth at bedtime. For mood control Patient not taking: Reported on 08/22/2016 01/25/16   Michael Kava, NP  lidocaine (XYLOCAINE) 2 % solution Use as directed 15 mLs in the mouth or throat as needed for mouth pain. 10/14/16   Michael C Joy, PA-C  naproxen (NAPROSYN) 500 MG tablet Take 1 tablet (500 mg total) by mouth 2 (two) times daily. 10/31/16   Michael Simmonds, PA-C  omeprazole (PRILOSEC) 20 MG capsule Take 1 capsule (20 mg total) by mouth 2 (two) times daily before a meal. 11/22/16   Michael Simmonds, PA-C  penicillin v potassium (VEETID) 500 MG tablet Take 1 tablet (500 mg total) by mouth 3 (three) times daily. 10/31/16   Michael Simmonds, PA-C  polyethylene glycol powder (GLYCOLAX/MIRALAX) powder Take 17 g by mouth 2 (two) times daily as needed. 05/31/16   Michael Simmonds, PA-C     Objective:  EXAM:   Vitals:   11/22/16 0945  BP: 132/83  Pulse:  72  Resp: 16  Temp: 98.2 F (36.8 C)  TempSrc: Oral  SpO2: 98%  Weight: 192 lb 6.4 oz (87.3 kg)    General appearance : A&OX3. NAD. Non-toxic-appearing HEENT: Atraumatic and Normocephalic.  PERRLA. EOM intact.  Some cerumen in L canal, but R TM is blocked with large amount of cerumen that is right up against the TM completely obstructing it.  Mouth-MMM, post pharynx WNL w/o erythema, No PND.  Poor dentition but no sign of abscess or active infection currently.   Neck: supple, no JVD. No cervical lymphadenopathy. No thyromegaly Chest/Lungs:  Breathing-non-labored, Good air entry bilaterally, breath sounds normal without rales, rhonchi, or wheezing    CVS: S1 S2 regular, no murmurs, gallops, rubs  GU:  Normal circumcised male.  No discharge from the penis.  B testicles without lump or mass.  No hernia. Extremities: Bilateral Lower Ext shows no edema, both legs are warm to touch with = pulse throughout Neurology:  CN II-XII grossly intact, Non focal.   Psych:  TP linear. J/I WNL. Normal speech. Appropriate eye contact and affect.  Skin:  No Rash  Data Review Lab Results  Component Value Date   HGBA1C 5.5 01/21/2016   Procedure:  Debrox in R ear X 15 mins then irrigated successfully.  TM appears normal after irrigation.  Assessment & Plan   1. Right groin pain No abnormality detected today.  Use Ibuprofen/tylenol.  RTC if doesn't improve or if it worsens or to ED if severe.  2. Gastroesophageal reflux disease without esophagitis Also intermittent constipation Inadequate water intake.  Drink 80-100 ounces of water per day. Stop protonix start- omeprazole (PRILOSEC) 20 MG capsule; Take 1 capsule (20 mg total) by mouth 2 (two) times daily before a meal.  Dispense: 60 capsule; Refill: 3 Make appt with GI for f/up  if s/sx do not improve  3. Bilateral impacted cerumen Debrox then irrigation which was successful today  4. Otalgia of right ear Likely will resolve now that cerumen is no longer pressing against TM   Patient have been counseled extensively about nutrition and exercise  Return if symptoms worsen or fail to improve.  The patient was given clear instructions to go to ER or return to medical center if symptoms don't improve, worsen or new problems develop. The patient verbalized understanding. The patient was told to call to get lab results if they haven't heard anything in the next week.     Michael CoAngela Caia Lofaro, PA-C [facility] [place], [place] [phone]   11/22/2016, 10:33 AMPatient ID: Michael CedarsDerrick Daugherty, male   DOB: [birth year], 41 y.o.   MRN: [id number]

## 2016-11-22 NOTE — Patient Instructions (Addendum)
Drink 80-100 ounces of water daily.  If you continue to have problems with reflux or constipation, follow-up with Kirtland BouchardK. Scherry RanVeena Nandigam , MD [phone] Mon-Fri 8a-5p

## 2016-12-28 MED FILL — ?OMEPRAZOLE DR 20 MG CAPSUL: 20 | 30 days supply | Qty: 60 | Fill #1

## 2017-02-20 ENCOUNTER — Encounter: Payer: Self-pay | Admitting: Family Medicine

## 2017-02-26 MED FILL — ?OMEPRAZOLE DR 20 MG CAPSUL: 20 | 30 days supply | Qty: 60 | Fill #2

## 2017-03-08 ENCOUNTER — Encounter (HOSPITAL_COMMUNITY): Payer: Self-pay | Admitting: Nurse Practitioner

## 2017-03-08 ENCOUNTER — Emergency Department (HOSPITAL_COMMUNITY)
Admission: EM | Admit: 2017-03-08 | Discharge: 2017-03-08 | Disposition: A | Discharge status: Home / Self Care | Payer: Self-pay | Attending: Emergency Medicine | Admitting: Emergency Medicine

## 2017-03-08 DIAGNOSIS — Z79899 Other long term (current) drug therapy: Secondary | ICD-10-CM | POA: Insufficient documentation

## 2017-03-08 DIAGNOSIS — J029 Acute pharyngitis, unspecified: Secondary | ICD-10-CM | POA: Insufficient documentation

## 2017-03-08 DIAGNOSIS — F17210 Nicotine dependence, cigarettes, uncomplicated: Secondary | ICD-10-CM | POA: Insufficient documentation

## 2017-03-08 DIAGNOSIS — F909 Attention-deficit hyperactivity disorder, unspecified type: Secondary | ICD-10-CM | POA: Insufficient documentation

## 2017-03-08 NOTE — ED Triage Notes (Signed)
Pt presents with c/o URI. The symptoms began several days ago and have been progressively  worse since onset. He reports chills, nasal and chest congestion, sore throat, cough. He has a history of seasonal allergies. He has been taking OTC allergy medication with no relief

## 2017-03-08 NOTE — ED Provider Notes (Signed)
[facility] Provider Note   CSN: [id number] Arrival date & time: 03/08/17  1423  By signing my name below, I, Linna Darner, attest that this documentation has been prepared under the direction and in the presence of Lyndel Safe, [place]. Electronically Signed: Linna Darner, Scribe. 03/08/2017. 4:00 PM.  History   Chief Complaint Chief Complaint  Patient presents with  . URI   The history is provided by the patient. No language interpreter was used.    HPI Comments: Samarth Ogle is a 41 y.o. male with PMHx including environmental allergies who presents to the Emergency Department complaining of persistent, gradually worsening nasal congestion beginning three days ago. He reports some associated rhinorrhea, nausea without vomiting, sneezing, a non-productive cough, and a sore throat. He has been taking OTC certirizine x2 daily for four days without significant improvement of his symptoms. Patient notes his supervisor told him he needed to be medically cleared before returning to work and is here requesting a work note. He denies rashes, dyspnea, chest pain, abdominal pain, or any other associated symptoms.  Past Medical History:  Diagnosis Date  . Attention deficit disorder (ADD)   . Seasonal allergies     Patient Active Problem List   Diagnosis Date Noted  . Pain, dental 07/25/2016  . 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 Cannabis use disorder, moderate, dependence (HCC) 01/23/2016  . Alcohol use disorder, mild, abuse 01/23/2016  . Hyperlipidemia 01/23/2016  . Hx of seizure disorder 01/23/2016  . Intermittent explosive disorder 01/20/2016  . Constipation 10/19/2015  . Environmental allergies 08/24/2014  . Excess ear wax 08/24/2014  . Dyspepsia 08/24/2014  . Nasal congestion 08/24/2014    Past Surgical History:  Procedure Laterality Date  . RHINOPLASTY         Home Medications    Prior to  Admission medications   Medication Sig Start Date End Date Taking? Authorizing Provider  cetirizine (ZYRTEC) 10 MG tablet Take 1 tablet (10 mg total) by mouth daily. For allergies 05/31/16   Georgian Co M, PA-C  divalproex (DEPAKOTE ER) 500 MG 24 hr tablet Take 1 tablet (500 mg total) by mouth at bedtime. For mood stabilization Patient not taking: Reported on 08/22/2016 01/25/16   Armandina Stammer I, NP  haloperidol (HALDOL) 2 MG tablet Take 1 tablet (2 mg total) by mouth at bedtime. For mood control Patient not taking: Reported on 08/22/2016 01/25/16   Armandina Stammer I, NP  lidocaine (XYLOCAINE) 2 % solution Use as directed 15 mLs in the mouth or throat as needed for mouth pain. 10/14/16   Joy, Shawn C, PA-C  naproxen (NAPROSYN) 500 MG tablet Take 1 tablet (500 mg total) by mouth 2 (two) times daily. 10/31/16   Anders Simmonds, PA-C  omeprazole (PRILOSEC) 20 MG capsule Take 1 capsule (20 mg total) by mouth 2 (two) times daily before a meal. 11/22/16   McClung, Marzella Schlein, PA-C  penicillin v potassium (VEETID) 500 MG tablet Take 1 tablet (500 mg total) by mouth 3 (three) times daily. 10/31/16   Anders Simmonds, PA-C  polyethylene glycol powder (GLYCOLAX/MIRALAX) powder Take 17 g by mouth 2 (two) times daily as needed. 05/31/16   Anders Simmonds, PA-C    Family History Family History  Problem Relation Age of Onset  . Diabetes Mother   . Stomach cancer Mother   . Stomach cancer Father   . Diabetes Brother   . Stomach cancer Brother   . Mental illness Neg  Hx     Social History Social History  Substance Use Topics  . Smoking status: Current Some Day Smoker    Types: Cigarettes  . Smokeless tobacco: Never Used  . Alcohol use Yes     Allergies   Other; Oysters [shellfish allergy]; Soybean-containing drug products; and Strawberry extract   Review of Systems Review of Systems  Constitutional: Positive for chills.  HENT: Positive for congestion, rhinorrhea, sneezing and sore throat.     Respiratory: Positive for cough. Negative for shortness of breath.   Cardiovascular: Negative for chest pain.  Gastrointestinal: Positive for nausea. Negative for abdominal pain and vomiting.  Skin: Negative for rash.   Physical Exam Updated Vital Signs BP 118/89   Pulse 74   Temp 97.8 F (36.6 C) (Oral)   Resp 17   SpO2 100%   Physical Exam  Constitutional: He appears well-developed and well-nourished. No distress.  HENT:  Head: Normocephalic and atraumatic.  Right Ear: Tympanic membrane and ear canal normal.  Left Ear: Tympanic membrane and ear canal normal.  Nose: Nose normal. No mucosal edema or rhinorrhea. No epistaxis. Right sinus exhibits no maxillary sinus tenderness and no frontal sinus tenderness. Left sinus exhibits no maxillary sinus tenderness and no frontal sinus tenderness.  Mouth/Throat: Uvula is midline, oropharynx is clear and moist and mucous membranes are normal. No trismus in the jaw. No dental abscesses or uvula swelling. No oropharyngeal exudate, posterior oropharyngeal edema, posterior oropharyngeal erythema or tonsillar abscesses. No tonsillar exudate.  Eyes: Conjunctivae and EOM are normal.  Neck: Trachea normal, normal range of motion and full passive range of motion without pain. Neck supple. No neck rigidity. No tracheal deviation and normal range of motion present.  Cardiovascular: Normal rate.   Pulmonary/Chest: Effort normal and breath sounds normal. No stridor. No respiratory distress. He has no decreased breath sounds. He has no wheezes. He has no rhonchi. He has no rales.  Nursing note and vitals reviewed.  ED Treatments / Results  Labs (all labs ordered are listed, but only abnormal results are displayed) Labs Reviewed - No data to display  EKG  EKG Interpretation None       Radiology No results found.  Procedures Procedures (including critical care time)  DIAGNOSTIC STUDIES: Oxygen Saturation is 100% on RA, normal by my  interpretation.    COORDINATION OF CARE: 3:59 PM Discussed treatment plan with pt at bedside and pt agreed to plan.  Medications Ordered in ED Medications - No data to display   Initial Impression / Assessment and Plan / ED Course  I have reviewed the triage vital signs and the nursing notes.  Pertinent labs & imaging results that were available during my care of the patient were reviewed by me and considered in my medical decision making (see chart for details).    Javis Abboud presents with symptoms consistent with seasonal allergies.  Patient is afebrile with out throat erythema, edema, post nasal drip.  Lungs CTAB.  I do not suspect pneumonia, strep throat, PTA, deep space infection or ear infection.  He was instructed to use cetrizine once a day, as directed on the package, and to supplement with nasal steroid spray as needed.  He was given a work note.  Patient was given the option to ask questions, all of which were answered to the best of my ability.  Patient given work note.    At this time there does not appear to be any evidence of an acute emergency medical condition and  the patient appears stable for discharge with appropriate outpatient follow up.Diagnosis was discussed with patient who verbalizes understanding and is agreeable to discharge.   Final Clinical Impressions(s) / ED Diagnoses   Final diagnoses:  Sore throat    New Prescriptions Discharge Medication List as of 03/08/2017  4:06 PM     I personally performed the services described in this documentation, which was scribed in my presence. The recorded information has been reviewed and is accurate.    Cristina GongHammond, Nella Botsford W, PA-C 03/08/17 1727    Nira Connardama, Pedro Eduardo, MD 03/12/17 [phone]

## 2017-03-15 ENCOUNTER — Ambulatory Visit: Payer: Self-pay

## 2017-03-18 ENCOUNTER — Telehealth: Payer: Self-pay | Admitting: Gastroenterology

## 2017-03-18 NOTE — Telephone Encounter (Signed)
Constipation and indigestion. His Protonix was not working well and was changed to Omeprazole 20 mg BID. This was also free for him. Protonix costs $10 a month. He is unemployed. He is supplementing his Omeprazole with sometimes an extra pill. Sometimes he takes what his mother takes (doesn't know what it is). Sometimes he will take FD gard. Patient's complaints are uncontrolled GERD.He also has ongoing issues with constipation. He occasionally uses Mag Citrate. He has not been taking Miralax regularly. Tried to discuss how Miralax works. He has not had a bowel movement is several days. He will use Mag Citrate, then begin daily Miralax as per the OV note from Fall of 2017.  Please advise on the GERD

## 2017-03-19 NOTE — Telephone Encounter (Signed)
Left a message to call back.

## 2017-03-19 NOTE — Telephone Encounter (Signed)
Patient is in agreement with this plan. 

## 2017-03-19 NOTE — Telephone Encounter (Signed)
Please advise patient to continue to take omeprazole 20 mg twice daily, if he is having frequent episodes of heartburn more than 3 times a week will consider increasing the dose to 40 mg twice daily. MiraLAX daily to prevent constipation

## 2017-04-08 MED FILL — ?OMEPRAZOLE DR 20 MG CAPSUL: 20 | 30 days supply | Qty: 60 | Fill #3

## 2017-05-20 ENCOUNTER — Telehealth: Payer: Self-pay | Admitting: Gastroenterology

## 2017-05-20 ENCOUNTER — Other Ambulatory Visit: Payer: Self-pay | Admitting: Physician Assistant

## 2017-05-20 DIAGNOSIS — K219 Gastro-esophageal reflux disease without esophagitis: Secondary | ICD-10-CM

## 2017-05-20 MED ORDER — OMEPRAZOLE 20 MG PO CPDR: 20.0000 mg | DELAYED_RELEASE_CAPSULE | Freq: Two times a day (BID) | ORAL | 3 refills | Status: DC

## 2017-05-20 MED FILL — OMEPRAZOLE 20 MG CAP: 20 | 30 days supply | Qty: 60 | Fill #0

## 2017-05-20 NOTE — Telephone Encounter (Signed)
[facility]

## 2017-06-28 MED FILL — ?OMEPRAZOLE DR 20 MG CAPSUL: 20 | 30 days supply | Qty: 60 | Fill #1

## 2017-07-23 MED FILL — ?OMEPRAZOLE DR 20 MG CAPSUL: 20 | 30 days supply | Qty: 60 | Fill #2

## 2017-07-25 ENCOUNTER — Encounter (HOSPITAL_COMMUNITY): Payer: Self-pay | Admitting: Emergency Medicine

## 2017-07-25 ENCOUNTER — Ambulatory Visit (HOSPITAL_COMMUNITY)
Admission: EM | Admit: 2017-07-25 | Discharge: 2017-07-25 | Disposition: A | Discharge status: Home / Self Care | Payer: Self-pay | Attending: Emergency Medicine | Admitting: Emergency Medicine

## 2017-07-25 DIAGNOSIS — J069 Acute upper respiratory infection, unspecified: Secondary | ICD-10-CM

## 2017-07-25 NOTE — ED Triage Notes (Signed)
Pt also c/o R knee pain, ambulatory.

## 2017-07-25 NOTE — Discharge Instructions (Signed)
Continue taking the antihistamine, Zyrtec. Add Sudafed PE 10 mg every 4 hours as needed for congestion. Drink plenty of fluids and stay well-hydrated.

## 2017-07-25 NOTE — ED Triage Notes (Signed)
Pt c/o dry cough after mixing chemicals after cleaning the bathroom.

## 2017-07-25 NOTE — ED Notes (Signed)
Patient discharged by provider Domenick GongAshley Mortenson, MD

## 2017-07-25 NOTE — ED Provider Notes (Signed)
[facility]    CSN: [id number] Arrival date & time: 07/25/17  1543     History   Chief Complaint Chief Complaint  Patient presents with  . Cough  . Knee Pain    HPI Michael Daugherty is a 41 y.o. male.   41 year old male presents to the urgent care with stuffy nose, little pressure in the face and ears. Currently denies cough or shortness of breath. He does mention that he was cleaning the bathroom with different chemicals yesterday,, comet and mold spray. At that time he had some coughing, watery eyes and a brief episode of shortness of breath. The symptoms have since abated. He also wanted to mention that he been having some gas pain after taking a laxative. That too has been getting better. He states this all he wanted to tell me as well as advice on what to do for his stuffy nose.      Past Medical History:  Diagnosis Date  . Attention deficit disorder (ADD)   . Seasonal allergies     Patient Active Problem List   Diagnosis Date Noted  . Pain, dental 07/25/2016  . 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 Cannabis use disorder, moderate, dependence (HCC) 01/23/2016  . Alcohol use disorder, mild, abuse 01/23/2016  . Hyperlipidemia 01/23/2016  . Hx of seizure disorder 01/23/2016  . Intermittent explosive disorder 01/20/2016  . Constipation 10/19/2015  . Environmental allergies 08/24/2014  . Excess ear wax 08/24/2014  . Dyspepsia 08/24/2014  . Nasal congestion 08/24/2014    Past Surgical History:  Procedure Laterality Date  . RHINOPLASTY         Home Medications    Prior to Admission medications   Medication Sig Start Date End Date Taking? Authorizing Provider  cetirizine (ZYRTEC) 10 MG tablet Take 1 tablet (10 mg total) by mouth daily. For allergies 05/31/16  Yes Anders Simmonds, PA-C  omeprazole (PRILOSEC) 20 MG capsule Take 1 capsule (20 mg total) by mouth 2 (two) times daily before  a meal. 05/20/17  Yes Nandigam, Kavitha V, MD  polyethylene glycol powder (GLYCOLAX/MIRALAX) powder Take 17 g by mouth 2 (two) times daily as needed. 05/31/16  Yes Anders Simmonds, PA-C    Family History Family History  Problem Relation Age of Onset  . Diabetes Mother   . Stomach cancer Mother   . Stomach cancer Father   . Diabetes Brother   . Stomach cancer Brother   . Mental illness Neg Hx     Social History Social History  Substance Use Topics  . Smoking status: Current Some Day Smoker    Types: Cigarettes  . Smokeless tobacco: Never Used  . Alcohol use Yes     Allergies   Other; Oysters [shellfish allergy]; Soybean-containing drug products; and Strawberry extract   Review of Systems Review of Systems  Constitutional: Negative for activity change, chills and fever.  HENT: Positive for congestion and sinus pressure. Negative for ear pain.   Eyes: Negative.   Respiratory:       Currently no respiratory symptoms.  Gastrointestinal:       Abdominal gas pain and bloating. Restarting his Prilosec today  Genitourinary: Negative.   All other systems reviewed and are negative.    Physical Exam Triage Vital Signs ED Triage Vitals [07/25/17 1645]  Enc Vitals Group     BP 129/71     Pulse Rate 71     Resp 16  Temp 97.7 F (36.5 C)     Temp Source Oral     SpO2 100 %     Weight      Height      Head Circumference      Peak Flow      Pain Score 10     Pain Loc      Pain Edu?      Excl. in GC?    No data found.   Updated Vital Signs BP 129/71   Pulse 71   Temp 97.7 F (36.5 C) (Oral)   Resp 16   SpO2 100%   Visual Acuity Right Eye Distance:   Left Eye Distance:   Bilateral Distance:    Right Eye Near:   Left Eye Near:    Bilateral Near:     Physical Exam  Constitutional: He is oriented to person, place, and time. He appears well-developed and well-nourished. No distress.  HENT:  Head: Normocephalic and atraumatic.  Left TM is normal. EAC  is clear. Right EAC with excessive cerumen portion of the TM is seen is pearly gray. Oropharynx with light clear PND, no exudate.  Neck: Normal range of motion. Neck supple.  Cardiovascular: Normal rate, regular rhythm and normal heart sounds.   Pulmonary/Chest: Effort normal and breath sounds normal. No respiratory distress. He has no wheezes. He has no rales.  Musculoskeletal: Normal range of motion. He exhibits no edema.  Lymphadenopathy:    He has no cervical adenopathy.  Neurological: He is alert and oriented to person, place, and time.  Skin: Skin is warm and dry. No rash noted.  Psychiatric: He has a normal mood and affect.  Nursing note and vitals reviewed.    UC Treatments / Results  Labs (all labs ordered are listed, but only abnormal results are displayed) Labs Reviewed - No data to display  EKG  EKG Interpretation None       Radiology No results found.  Procedures Procedures (including critical care time)  Medications Ordered in UC Medications - No data to display   Initial Impression / Assessment and Plan / UC Course  I have reviewed the triage vital signs and the nursing notes.  Pertinent labs & imaging results that were available during my care of the patient were reviewed by me and considered in my medical decision making (see chart for details).    Continue taking the antihistamine, Zyrtec. Add Sudafed PE 10 mg every 4 hours as needed for congestion. Drink plenty of fluids and stay well-hydrated.     Final Clinical Impressions(s) / UC Diagnoses   Final diagnoses:  Viral upper respiratory tract infection    New Prescriptions Current Discharge Medication List       Controlled Substance Prescriptions [place] Controlled Substance Registry consulted? Not Applicable   Hayden RasmussenMabe, Fizza Scales, NP 07/25/17 [phone]

## 2017-08-09 ENCOUNTER — Telehealth: Payer: Self-pay | Admitting: Gastroenterology

## 2017-08-09 MED FILL — ?OMEPRAZOLE DR 20 MG CAPSUL: 20 | 30 days supply | Qty: 60 | Fill #3

## 2017-08-09 NOTE — Telephone Encounter (Signed)
Called pharmacy for patient, they said they are working now on refilling his prescription of omeprazole its not too earlt, seems patient was confused  He will pick up his omeprazole Monday

## 2017-09-02 ENCOUNTER — Telehealth: Payer: Self-pay | Admitting: Gastroenterology

## 2017-09-02 ENCOUNTER — Other Ambulatory Visit: Payer: Self-pay | Admitting: Gastroenterology

## 2017-09-02 DIAGNOSIS — K219 Gastro-esophageal reflux disease without esophagitis: Secondary | ICD-10-CM

## 2017-09-02 MED ORDER — OMEPRAZOLE 20 MG PO CPDR: DELAYED_RELEASE_CAPSULE | ORAL | 3 refills | Status: DC

## 2017-09-02 MED FILL — ?OMEPRAZOLE DR 20MG CAPSULE: 20 | 30 days supply | Qty: 60 | Fill #0

## 2017-09-02 NOTE — Telephone Encounter (Signed)
Omeprazole sent to pharmacy.

## 2017-09-16 ENCOUNTER — Ambulatory Visit: Payer: Self-pay

## 2017-09-23 ENCOUNTER — Telehealth: Payer: Self-pay | Admitting: Gastroenterology

## 2017-09-23 ENCOUNTER — Ambulatory Visit: Payer: Self-pay | Attending: Internal Medicine

## 2017-09-23 DIAGNOSIS — K219 Gastro-esophageal reflux disease without esophagitis: Secondary | ICD-10-CM

## 2017-09-23 MED ORDER — OMEPRAZOLE 20 MG PO CPDR: DELAYED_RELEASE_CAPSULE | ORAL | 3 refills | Status: DC

## 2017-09-23 NOTE — Telephone Encounter (Signed)
[facility]

## 2017-10-02 MED FILL — ?OMEPRAZOLE DR 20MG CAPSULE: 20 | 30 days supply | Qty: 60 | Fill #1

## 2017-10-22 ENCOUNTER — Telehealth: Payer: Self-pay | Admitting: Gastroenterology

## 2017-10-22 DIAGNOSIS — K219 Gastro-esophageal reflux disease without esophagitis: Secondary | ICD-10-CM

## 2017-10-22 MED ORDER — OMEPRAZOLE 20 MG PO CPDR: DELAYED_RELEASE_CAPSULE | ORAL | 3 refills | Status: DC

## 2017-10-22 NOTE — Telephone Encounter (Signed)
L/m that med was sent into pharmacy

## 2017-10-29 MED FILL — ?OMEPRAZOLE DR 20MG CAPSULE: 20 | 30 days supply | Qty: 60 | Fill #2

## 2017-11-15 ENCOUNTER — Telehealth: Payer: Self-pay | Admitting: Gastroenterology

## 2017-11-18 NOTE — Telephone Encounter (Signed)
Left message to call back  

## 2017-11-19 ENCOUNTER — Other Ambulatory Visit: Payer: Self-pay

## 2017-11-19 ENCOUNTER — Telehealth: Payer: Self-pay | Admitting: Gastroenterology

## 2017-11-19 DIAGNOSIS — K219 Gastro-esophageal reflux disease without esophagitis: Secondary | ICD-10-CM

## 2017-11-19 MED ORDER — OMEPRAZOLE 20 MG PO CPDR: DELAYED_RELEASE_CAPSULE | ORAL | 0 refills | Status: DC

## 2017-11-19 MED ORDER — OMEPRAZOLE 40 MG PO CPDR: DELAYED_RELEASE_CAPSULE | ORAL | 0 refills | Status: DC

## 2017-11-19 MED FILL — ?OMEPRAZOLE 20 MG CPDR: 20 | 30 days supply | Qty: 120 | Fill #0

## 2017-11-19 NOTE — Telephone Encounter (Signed)
Patient is scheduled for OV on 12-18-17

## 2017-11-19 NOTE — Telephone Encounter (Signed)
Spoke with the patient's mother. She will relay message of my call to the patient. [facility]. The formulary they have allows the patient to get Omeprazole 20 mg free.  Omeprazole 40 mg costs $10.00 for 30 days. Based on the order given By Dr Lavon PaganiniNandigam, I have given the pharmacy a prescription for omeprazole 20 mg two tablets twice daily. 30 day Rx given to cover until his appointment.

## 2017-11-19 NOTE — Telephone Encounter (Signed)
He hasnt been seen in office since Nov 2017. Dont see any notes where he was recommended to take 4 Omeprazole 40mg  per day. Please send Rx for Omeprazole 40mg  BID and schedule follow up visit

## 2017-11-19 NOTE — Telephone Encounter (Signed)
No answer. Left message. If his symptoms breakthrough despite medication, he needs to be seen.

## 2017-12-02 ENCOUNTER — Encounter (HOSPITAL_COMMUNITY): Payer: Self-pay | Admitting: Emergency Medicine

## 2017-12-02 ENCOUNTER — Ambulatory Visit (HOSPITAL_COMMUNITY)
Admission: EM | Admit: 2017-12-02 | Discharge: 2017-12-02 | Disposition: A | Discharge status: Home / Self Care | Payer: Medicaid Other | Attending: Family Medicine | Admitting: Family Medicine

## 2017-12-02 DIAGNOSIS — H9201 Otalgia, right ear: Secondary | ICD-10-CM

## 2017-12-02 MED ORDER — IBUPROFEN 800 MG PO TABS
ORAL_TABLET | ORAL | Status: AC
  Filled 2017-12-02: qty 1

## 2017-12-02 MED ORDER — AMOXICILLIN 500 MG PO CAPS: 500.0000 mg | ORAL_CAPSULE | Freq: Two times a day (BID) | ORAL | 0 refills | Status: DC

## 2017-12-02 MED ORDER — IBUPROFEN 800 MG PO TABS
800.0000 mg | ORAL_TABLET | Freq: Once | ORAL | Status: AC
  Administered 2017-12-02: 800 mg via ORAL

## 2017-12-02 MED ORDER — FLUTICASONE PROPIONATE 50 MCG/ACT NA SUSP: 1.0000 | Freq: Every day | NASAL | 2 refills | Status: AC

## 2017-12-02 MED ORDER — FLUTICASONE PROPIONATE 50 MCG/ACT NA SUSP: 1.0000 | Freq: Every day | NASAL | 2 refills | Status: DC

## 2017-12-02 NOTE — Discharge Instructions (Signed)
Push fluids to ensure adequate hydration and keep secretions thin.  Complete course of antibiotics.   Daily nasal spray for symptoms as well. If symptoms worsen or do not improve in the next week to return to be seen or to follow up with your PCP.

## 2017-12-02 NOTE — ED Triage Notes (Signed)
PT reports right ear, eye, and facial pain.

## 2017-12-02 NOTE — ED Provider Notes (Signed)
[facility]    CSN: [id number] Arrival date & time: 12/02/17  1519     History   Chief Complaint Chief Complaint  Patient presents with  . Otalgia    HPI Michael Daugherty is a 42 y.o. male.   Michael Daugherty presents with complaints of right ear pain which causes right facial pain. Has been present for the past 2-3 days. Throbbing pain, 10/10. Has been taking tylenol which briefly helps. Denies dental pain, sore throat, congestion symptoms. States has had similar and needed his ear flushed due to cerumen. Hx of allergies, add, seizures.    ROS per HPI.       Past Medical History:  Diagnosis Date  . Attention deficit disorder (ADD)   . Seasonal allergies     Patient Active Problem List   Diagnosis Date Noted  . Pain, dental 07/25/2016  . 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 Cannabis use disorder, moderate, dependence (HCC) 01/23/2016  . Alcohol use disorder, mild, abuse 01/23/2016  . Hyperlipidemia 01/23/2016  . Hx of seizure disorder 01/23/2016  . Intermittent explosive disorder 01/20/2016  . Constipation 10/19/2015  . Environmental allergies 08/24/2014  . Excess ear wax 08/24/2014  . Dyspepsia 08/24/2014  . Nasal congestion 08/24/2014    Past Surgical History:  Procedure Laterality Date  . RHINOPLASTY         Home Medications    Prior to Admission medications   Medication Sig Start Date End Date Taking? Authorizing Provider  omeprazole (PRILOSEC) 20 MG capsule TAKE 2 CAPSULE BY MOUTH 2 TIMES DAILY BEFORE A MEAL. 11/19/17  Yes Nandigam, Eleonore ChiquitoKavitha V, MD  amoxicillin (AMOXIL) 500 MG capsule Take 1 capsule (500 mg total) by mouth 2 (two) times daily for 7 days. 12/02/17 12/09/17  Georgetta HaberBurky, Joangel Vanosdol B, NP  cetirizine (ZYRTEC) 10 MG tablet Take 1 tablet (10 mg total) by mouth daily. For allergies 05/31/16   Georgian CoMcClung, Angela M, PA-C  fluticasone [facility](FLONASE) 50 MCG/ACT nasal spray Place 1 spray into both  nostrils daily. 12/02/17   Linus MakoBurky, Sherral Dirocco B, NP  polyethylene glycol powder (GLYCOLAX/MIRALAX) powder Take 17 g by mouth 2 (two) times daily as needed. 05/31/16   Anders SimmondsMcClung, Angela M, PA-C    Family History Family History  Problem Relation Age of Onset  . Diabetes Mother   . Stomach cancer Mother   . Stomach cancer Father   . Diabetes Brother   . Stomach cancer Brother   . Mental illness Neg Hx     Social History Social History   Tobacco Use  . Smoking status: Current Some Day Smoker    Types: Cigarettes  . Smokeless tobacco: Never Used  Substance Use Topics  . Alcohol use: Yes  . Drug use: Yes    Types: Marijuana     Allergies   Other; Oysters [shellfish allergy]; Soybean-containing drug products; and Strawberry extract   Review of Systems Review of Systems   Physical Exam Triage Vital Signs ED Triage Vitals  Enc Vitals Group     BP 12/02/17 1633 (!) 167/84     Pulse Rate 12/02/17 1633 74     Resp 12/02/17 1633 20     Temp 12/02/17 1633 97.6 F (36.4 C)     Temp Source 12/02/17 1633 Oral     SpO2 12/02/17 1633 100 %     Weight 12/02/17 1637 192 lb (87.1 kg)     Height --      Head Circumference --  Peak Flow --      Pain Score 12/02/17 1637 9     Pain Loc --      Pain Edu? --      Excl. in GC? --    No data found.  Updated Vital Signs BP (!) 167/84 (BP Location: Right Arm) Comment: Notified Janee  Pulse 74   Temp 97.6 F (36.4 C) (Oral)   Resp 20   Wt 192 lb (87.1 kg)   SpO2 100%   BMI 30.99 kg/m   Visual Acuity Right Eye Distance:   Left Eye Distance:   Bilateral Distance:    Right Eye Near:   Left Eye Near:    Bilateral Near:     Physical Exam  Constitutional: He is oriented to person, place, and time. He appears well-developed and well-nourished.  HENT:  Head: Normocephalic and atraumatic.  Right Ear: External ear and ear canal normal. Tympanic membrane is bulging. A middle ear effusion is present.  Left Ear: Tympanic membrane,  external ear and ear canal normal.  Nose: Nose normal. Right sinus exhibits no maxillary sinus tenderness and no frontal sinus tenderness. Left sinus exhibits no maxillary sinus tenderness and no frontal sinus tenderness.  Mouth/Throat: Uvula is midline, oropharynx is clear and moist and mucous membranes are normal.  Very minimal erythema to right TM present  Eyes: Conjunctivae are normal. Pupils are equal, round, and reactive to light.  Neck: Normal range of motion.  Cardiovascular: Normal rate and regular rhythm.  Pulmonary/Chest: Effort normal and breath sounds normal.  Lymphadenopathy:    He has no cervical adenopathy.  Neurological: He is alert and oriented to person, place, and time.  Skin: Skin is warm and dry.  Vitals reviewed.    UC Treatments / Results  Labs (all labs ordered are listed, but only abnormal results are displayed) Labs Reviewed - No data to display  EKG  EKG Interpretation None       Radiology No results found.  Procedures Procedures (including critical care time)  Medications Ordered in UC Medications  ibuprofen (ADVIL,MOTRIN) tablet 800 mg (not administered)     Initial Impression / Assessment and Plan / UC Course  I have reviewed the triage vital signs and the nursing notes.  Pertinent labs & imaging results that were available during my care of the patient were reviewed by me and considered in my medical decision making (see chart for details).     Will treat as otitis at this time although very mild. Severe pain for patient. Without other sources of pain found on physical exam. Daily flonase recommended as well as ibuprofen for pain control. If symptoms worsen or do not improve in the next week to return to be seen or to follow up with PCP. Patient verbalized understanding and agreeable to plan.    Final Clinical Impressions(s) / UC Diagnoses   Final diagnoses:  Right ear pain    ED Discharge Orders        Ordered    amoxicillin  (AMOXIL) 500 MG capsule  2 times daily     12/02/17 1654    fluticasone (FLONASE) 50 MCG/ACT nasal spray  Daily     12/02/17 1654       Controlled Substance Prescriptions [place] Controlled Substance Registry consulted? Not Applicable   Georgetta Haber, NP 12/02/17 [phone]

## 2017-12-07 ENCOUNTER — Encounter (HOSPITAL_COMMUNITY): Payer: Self-pay | Admitting: Emergency Medicine

## 2017-12-07 ENCOUNTER — Ambulatory Visit (HOSPITAL_COMMUNITY)
Admission: EM | Admit: 2017-12-07 | Discharge: 2017-12-07 | Disposition: A | Discharge status: Home / Self Care | Payer: Medicaid Other | Attending: Family Medicine | Admitting: Family Medicine

## 2017-12-07 ENCOUNTER — Other Ambulatory Visit: Payer: Self-pay

## 2017-12-07 DIAGNOSIS — H66001 Acute suppurative otitis media without spontaneous rupture of ear drum, right ear: Secondary | ICD-10-CM

## 2017-12-07 DIAGNOSIS — K0889 Other specified disorders of teeth and supporting structures: Secondary | ICD-10-CM

## 2017-12-07 MED ORDER — AMOXICILLIN-POT CLAVULANATE 875-125 MG PO TABS: 1.0000 | ORAL_TABLET | Freq: Two times a day (BID) | ORAL | 0 refills | Status: DC

## 2017-12-07 MED ORDER — MELOXICAM 7.5 MG PO TABS: 7.5000 mg | ORAL_TABLET | Freq: Every day | ORAL | 0 refills | Status: DC

## 2017-12-07 MED ORDER — BENZOCAINE 10 % MT GEL: 1.0000 "application " | OROMUCOSAL | 0 refills | Status: DC | PRN

## 2017-12-07 NOTE — Discharge Instructions (Signed)
Start Augmentin for continued ear infection and possible dental infection. Mobic and oragel for pain. Follow up with dentist for further treatment and evaluation. If experiencing swelling of the throat, trouble breathing, trouble swallowing, follow up for reevaluation.

## 2017-12-07 NOTE — ED Provider Notes (Signed)
[facility]    CSN: [id number] Arrival date & time: 12/07/17  1220     History   Chief Complaint Chief Complaint  Patient presents with  . Dental Pain    HPI Michael Daugherty is a 42 y.o. male.   42 year old male comes in for continued right ear pain and possible dental pain after being seen 1 week ago.  States when  he was seen 1 week ago, right ear pain was severe, and no radiation of pain.  However, after taking amoxicillin and Flonase, ear pain has slightly decreased, and started noticing radiation pain to  teeth.  States he has multiple caries, and now wondering if pain was from dental problems versus ear infection.  He denies fever, chills, night sweats.  He finished amoxicillin yesterday, and has continued to use Flonase with good relief.  Has not taken anything for the pain.  Denies swelling of the throat, trouble breathing, trouble swallowing, drooling, tripoding.  He states he tried to follow-up with the dentist with a orange card, it was told to follow-up until his Medicaid kicks in.      Past Medical History:  Diagnosis Date  . Attention deficit disorder (ADD)   . Seasonal allergies     Patient Active Problem List   Diagnosis Date Noted  . Pain, dental 07/25/2016  . 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 Cannabis use disorder, moderate, dependence (HCC) 01/23/2016  . Alcohol use disorder, mild, abuse 01/23/2016  . Hyperlipidemia 01/23/2016  . Hx of seizure disorder 01/23/2016  . Intermittent explosive disorder 01/20/2016  . Constipation 10/19/2015  . Environmental allergies 08/24/2014  . Excess ear wax 08/24/2014  . Dyspepsia 08/24/2014  . Nasal congestion 08/24/2014    Past Surgical History:  Procedure Laterality Date  . RHINOPLASTY         Home Medications    Prior to Admission medications   Medication Sig Start Date End Date Taking? Authorizing Provider    amoxicillin-clavulanate (AUGMENTIN) 875-125 MG tablet Take 1 tablet by mouth every 12 (twelve) hours. 12/07/17   Cathie HoopsYu, Dalisha Shively V, PA-C  benzocaine (ORAJEL) 10 % mucosal gel Use as directed 1 application in the mouth or throat as needed for mouth pain. 12/07/17   Cathie HoopsYu, Cheetara Hoge V, PA-C  cetirizine (ZYRTEC) 10 MG tablet Take 1 tablet (10 mg total) by mouth daily. For allergies 05/31/16   Georgian CoMcClung, Angela M, PA-C  fluticasone [facility](FLONASE) 50 MCG/ACT nasal spray Place 1 spray into both nostrils daily. 12/02/17   Georgetta HaberBurky, Natalie B, NP  meloxicam (MOBIC) 7.5 MG tablet Take 1 tablet (7.5 mg total) by mouth daily. 12/07/17   Cathie HoopsYu, Kamy Poinsett V, PA-C  omeprazole (PRILOSEC) 20 MG capsule TAKE 2 CAPSULE BY MOUTH 2 TIMES DAILY BEFORE A MEAL. 11/19/17   Napoleon FormNandigam, Kavitha V, MD  polyethylene glycol powder (GLYCOLAX/MIRALAX) powder Take 17 g by mouth 2 (two) times daily as needed. 05/31/16   Anders SimmondsMcClung, Angela M, PA-C    Family History Family History  Problem Relation Age of Onset  . Diabetes Mother   . Stomach cancer Mother   . Stomach cancer Father   . Diabetes Brother   . Stomach cancer Brother   . Mental illness Neg Hx     Social History Social History   Tobacco Use  . Smoking status: Current Some Day Smoker    Types: Cigarettes  . Smokeless tobacco: Never Used  Substance Use Topics  . Alcohol use: Yes  . Drug use:  Yes    Types: Marijuana     Allergies   Other; Oysters [shellfish allergy]; Soybean-containing drug products; and Strawberry extract   Review of Systems Review of Systems  Reason unable to perform ROS: See HPI as above.     Physical Exam Triage Vital Signs ED Triage Vitals  Enc Vitals Group     BP 12/07/17 1330 (!) 112/93     Pulse Rate 12/07/17 1330 63     Resp 12/07/17 1330 18     Temp 12/07/17 1330 97.8 F (36.6 C)     Temp Source 12/07/17 1330 Oral     SpO2 12/07/17 1330 100 %     Weight --      Height --      Head Circumference --      Peak Flow --      Pain Score 12/07/17 1328 10     Pain  Loc --      Pain Edu? --      Excl. in GC? --    No data found.  Updated Vital Signs BP (!) 112/93 (BP Location: Left Arm)   Pulse 63   Temp 97.8 F (36.6 C) (Oral)   Resp 18   SpO2 100%   Visual Acuity Right Eye Distance:   Left Eye Distance:   Bilateral Distance:    Right Eye Near:   Left Eye Near:    Bilateral Near:     Physical Exam  Constitutional: He is oriented to person, place, and time. He appears well-developed and well-nourished. No distress.  HENT:  Head: Normocephalic and atraumatic.  Right Ear: External ear and ear canal normal. Tympanic membrane is erythematous. Tympanic membrane is not bulging.  Left Ear: Tympanic membrane, external ear and ear canal normal. Tympanic membrane is not erythematous and not bulging.  Nose: Nose normal. Right sinus exhibits no maxillary sinus tenderness and no frontal sinus tenderness. Left sinus exhibits no maxillary sinus tenderness and no frontal sinus tenderness.  Mouth/Throat: Uvula is midline, oropharynx is clear and moist and mucous membranes are normal. No tonsillar exudate.  Multiple dental caries throughout upper and lower jaw.  Multiple missing teeth.  Mild tenderness on palpation along anterior upper jaw.  Floor of mouth soft to palpation.  No facial tenderness/swelling.  Eyes: Conjunctivae are normal. Pupils are equal, round, and reactive to light.  Neurological: He is alert and oriented to person, place, and time.     UC Treatments / Results  Labs (all labs ordered are listed, but only abnormal results are displayed) Labs Reviewed - No data to display  EKG  EKG Interpretation None       Radiology No results found.  Procedures Procedures (including critical care time)  Medications Ordered in UC Medications - No data to display   Initial Impression / Assessment and Plan / UC Course  I have reviewed the triage vital signs and the nursing notes.  Pertinent labs & imaging results that were available  during my care of the patient were reviewed by me and considered in my medical decision making (see chart for details).    Augmentin for continued otitis media and possible dental infection. Symptomatic treatment as needed. Discussed with patient symptoms can return if dental problem is not addressed. Follow up with dentist for further evaluation and treatment of dental pain. Resources given. Return precautions given.   Final Clinical Impressions(s) / UC Diagnoses   Final diagnoses:  Acute suppurative otitis media of right ear without spontaneous rupture  of tympanic membrane, recurrence not specified  Pain, dental    ED Discharge Orders        Ordered    amoxicillin-clavulanate (AUGMENTIN) 875-125 MG tablet  Every 12 hours     12/07/17 1405    meloxicam (MOBIC) 7.5 MG tablet  Daily     12/07/17 1405    benzocaine (ORAJEL) 10 % mucosal gel  As needed     12/07/17 1405        Belinda Fisher, PA-C 12/07/17 1412

## 2017-12-07 NOTE — ED Triage Notes (Signed)
Patient seen 12/02/17 for the same.  Patient says he is taking antibiotic and pain medicine, but feeling worse.  Called out of work today

## 2017-12-09 ENCOUNTER — Ambulatory Visit (HOSPITAL_COMMUNITY)
Admission: EM | Admit: 2017-12-09 | Discharge: 2017-12-09 | Disposition: A | Discharge status: Home / Self Care | Payer: Medicaid Other | Attending: Emergency Medicine | Admitting: Emergency Medicine

## 2017-12-09 ENCOUNTER — Encounter (HOSPITAL_COMMUNITY): Payer: Self-pay | Admitting: Emergency Medicine

## 2017-12-09 ENCOUNTER — Other Ambulatory Visit: Payer: Self-pay

## 2017-12-09 DIAGNOSIS — R1013 Epigastric pain: Secondary | ICD-10-CM

## 2017-12-09 DIAGNOSIS — K2900 Acute gastritis without bleeding: Secondary | ICD-10-CM

## 2017-12-09 MED ORDER — FAMOTIDINE 20 MG PO TABS: 20.0000 mg | ORAL_TABLET | Freq: Two times a day (BID) | ORAL | 0 refills | Status: DC

## 2017-12-09 NOTE — ED Triage Notes (Signed)
Pt was here earlier for a note for work to excuse him from work yesterday for previous issue. Pt now here for abdominal pain related to GERD.  He states the Pepcid is not really helping.

## 2017-12-09 NOTE — ED Provider Notes (Signed)
HPI  SUBJECTIVE:  Michael Daugherty is a 42 y.o. male who presents with nonmigratory, nonradiating epigastric/periumbilical burning pain getting worse yesterday.  He states that he feels like he is getting better today.  He has had identical abdominal pain "for years".  It does not radiate to his back.  No belching, water brash, chest pain, shortness of breath.  Patient states that he feels bloated, but denies abdominal distention.  No fevers, urinary complaints, nausea, vomiting.  Had a normal bowel movement this morning.  Denies melena, hematochezia, recent EtOH use.  He has been taking Prilosec, MiraLAX.  The MiraLAX and having a bowel movement help.  No aggravating factors.  Is not associated eating, drinking, movement, urination.  Patient was seen here 2 days ago for persistent dental pain and right ear pain.  Started on Augmentin for otitis media and possible dental infection.  Also sent home with Mobic and benzocaine.  States that he is taking both Mobic and the Augmentin.  He has a past medical history of GERD for which he takes Prilosec.  He has a past medical history of alcohol abuse, but patient denies this.  No history of diabetes, hypertension, abdominal surgeries.  States that he had a normal colonoscopy several years ago.  [facility]: [facility]  Patient states that he has an appointment with GI on 3/13.  He is requesting a note to be able to go back to work on Wednesday.  He is off on Tuesday.  Past Medical History:  Diagnosis Date  . Attention deficit disorder (ADD)   . Seasonal allergies     Past Surgical History:  Procedure Laterality Date  . RHINOPLASTY      Family History  Problem Relation Age of Onset  . Diabetes Mother   . Stomach cancer Mother   . Stomach cancer Father   . Diabetes Brother   . Stomach cancer Brother   . Mental illness Neg Hx     Social History   Tobacco Use  . Smoking status: Current Some Day Smoker    Types: Cigarettes  .  Smokeless tobacco: Never Used  Substance Use Topics  . Alcohol use: Yes  . Drug use: Yes    Types: Marijuana    No current facility-administered medications for this encounter.   Current Outpatient Medications:  .  amoxicillin-clavulanate (AUGMENTIN) 875-125 MG tablet, Take 1 tablet by mouth every 12 (twelve) hours., Disp: 14 tablet, Rfl: 0 .  fluticasone (FLONASE) 50 MCG/ACT nasal spray, Place 1 spray into both nostrils daily., Disp: 16 g, Rfl: 2 .  omeprazole (PRILOSEC) 20 MG capsule, TAKE 2 CAPSULE BY MOUTH 2 TIMES DAILY BEFORE A MEAL., Disp: 120 capsule, Rfl: 0 .  polyethylene glycol powder (GLYCOLAX/MIRALAX) powder, Take 17 g by mouth 2 (two) times daily as needed., Disp: 3350 g, Rfl: 1 .  benzocaine (ORAJEL) 10 % mucosal gel, Use as directed 1 application in the mouth or throat as needed for mouth pain., Disp: 5.3 g, Rfl: 0 .  cetirizine (ZYRTEC) 10 MG tablet, Take 1 tablet (10 mg total) by mouth daily. For allergies, Disp: 90 tablet, Rfl: 2 .  famotidine (PEPCID) 20 MG tablet, Take 1 tablet (20 mg total) by mouth 2 (two) times daily., Disp: 60 tablet, Rfl: 0  Allergies  Allergen Reactions  . Other Hives    Pecan nuts  . Oysters [Shellfish Allergy] Hives  . Soybean-Containing Drug Products Hives  . Strawberry Extract Hives     ROS  As  noted in HPI.   Physical Exam  BP (!) 111/96 (BP Location: Left Arm)   Pulse 62   Temp 98 F (36.7 C) (Oral)   SpO2 100%   Constitutional: Well developed, well nourished, no acute distress Eyes:  EOMI, conjunctiva normal bilaterally HENT: Normocephalic, atraumatic,mucus membranes moist Respiratory: Normal inspiratory effort Cardiovascular: Normal rate GI: Appearance, soft, nontender, nondistended, negative Murphy, negative McBurney.  Active bowel sounds. Back: No CVAT skin: No rash, skin intact Musculoskeletal: no deformities Neurologic: Alert & oriented x 3, no focal neuro deficits Psychiatric: Speech and behavior  appropriate   ED Course   Medications - No data to display  No orders of the defined types were placed in this encounter.   No results found for this or any previous visit (from the past 24 hour(s)). No results found.  ED Clinical Impression  Acute gastritis without hemorrhage, unspecified gastritis type  Epigastric pain   ED Assessment/Plan  Previous records reviewed.  As noted in HPI.  Suspect gastritis from the Mobic.  No evidence of surgical abdomen.  Will start some Pepcid in addition to the Prilosec, advised him to stop taking the Mobic if he can at all help it, he has an appointment with GI on 3/13.  We will also write him a note that states that he is okay to go back to work on Wednesday.  He states that he is off today, Monday, and tomorrow, Tuesday.    Meds ordered this encounter  Medications  . famotidine (PEPCID) 20 MG tablet    Sig: Take 1 tablet (20 mg total) by mouth 2 (two) times daily.    Dispense:  60 tablet    Refill:  0    *This clinic note was created using Scientist, clinical (histocompatibility and immunogenetics)Dragon dictation software. Therefore, there may be occasional mistakes despite careful proofreading.   ?   Domenick GongMortenson, Mitali Shenefield, MD 12/11/17 1404

## 2017-12-09 NOTE — Discharge Instructions (Signed)
I suspect that your stomach irritation may be coming from the Mobic.  Stop taking it unless you absolutely have to.  Make sure you finish the Augmentin, however.  Start some Pepcid in addition to the Prilosec.  Go immediately to the ER for fevers above 100.4, vomiting, severe abdominal pain that is different than her usual abdominal pain, or for other concerns.

## 2017-12-09 NOTE — ED Triage Notes (Addendum)
Pt came into clinic earlier today wanting a note excusing him from work on Saturday and Sunday.  Pt was seen on Friday and given a note to return to work on Saturday.  Pt was told that we cannot write back dated notes without having been seen.   Pt states that his face and ear are feeling much better. Pt left the facility.

## 2017-12-10 MED FILL — ?FAMOTIDINE 20 MG TABLET: 20 | 30 days supply | Qty: 60 | Fill #0

## 2017-12-18 ENCOUNTER — Encounter: Payer: Self-pay | Admitting: Gastroenterology

## 2017-12-18 ENCOUNTER — Ambulatory Visit: Payer: Self-pay | Admitting: Gastroenterology

## 2017-12-18 VITALS — BP 130/78 | HR 78 | Ht 66.0 in | Wt 192.4 lb

## 2017-12-18 DIAGNOSIS — K219 Gastro-esophageal reflux disease without esophagitis: Secondary | ICD-10-CM

## 2017-12-18 DIAGNOSIS — K5900 Constipation, unspecified: Secondary | ICD-10-CM

## 2017-12-18 MED ORDER — FAMOTIDINE 20 MG PO TABS: 20.0000 mg | ORAL_TABLET | Freq: Every day | ORAL | 3 refills | Status: DC

## 2017-12-18 MED ORDER — OMEPRAZOLE 40 MG PO CPDR: 40.0000 mg | DELAYED_RELEASE_CAPSULE | Freq: Every day | ORAL | 3 refills | Status: DC

## 2017-12-18 MED FILL — OMEPRAZOLE DR 40 MG CAPSULE: 40 | 30 days supply | Qty: 30 | Fill #0

## 2017-12-18 NOTE — Progress Notes (Signed)
Michael Daugherty    [id number]    12-Mar-1976  Primary Care Physician:Patient, [name]  Referring Physician: Dessa PhiFunches, [name], MD No address on file  Chief complaint: GERD  HPI: 42 year old male with history of chronic GERD and constipation here for routine follow-up visit.  He is doing well with omeprazole 20 mg daily and also taking Pepcid 20 mg twice daily.  Denies any heartburn, regurgitation, dysphagia, odynophagia, nausea, vomiting, abdominal pain, melena or blood per rectum.  He is taking MiraLAX daily with regular bowel movements and denies any constipation or diarrhea.  He has switched jobs and recently started working at [employer], [employer] and is very happy with his position there.   Outpatient Encounter Medications as of 12/18/2017  Medication Sig  . benzocaine (ORAJEL) 10 % mucosal gel Use as directed 1 application in the mouth or throat as needed for mouth pain.  . cetirizine (ZYRTEC) 10 MG tablet Take 1 tablet (10 mg total) by mouth daily. For allergies  . famotidine (PEPCID) 20 MG tablet Take 1 tablet (20 mg total) by mouth 2 (two) times daily.  . fluticasone (FLONASE) 50 MCG/ACT nasal spray Place 1 spray into both nostrils daily.  [name]. omeprazole (PRILOSEC) 20 MG capsule TAKE 2 CAPSULE BY MOUTH 2 TIMES DAILY BEFORE A MEAL.  [name]. polyethylene glycol powder (GLYCOLAX/MIRALAX) powder Take 17 g by mouth 2 (two) times daily as needed.  . [DISCONTINUED] amoxicillin-clavulanate (AUGMENTIN) 875-125 MG tablet Take 1 tablet by mouth every 12 (twelve) hours.   No facility-administered encounter medications on file as of 12/18/2017.     Allergies as of 12/18/2017 - Review Complete 12/18/2017  Allergen Reaction Noted  . Other Hives 11/28/2013  . Oysters [shellfish allergy] Hives 11/28/2013  . Soybean-containing drug products Hives 11/28/2013  . Strawberry extract Hives 11/28/2013    Past Medical History:  Diagnosis Date  . Attention  deficit disorder (ADD)   . Seasonal allergies     Past Surgical History:  Procedure Laterality Date  . RHINOPLASTY      Family History  Problem Relation Age of Onset  . Diabetes Mother   . Stomach cancer Mother   . Stomach cancer Father   . Diabetes Brother   . Stomach cancer Brother   . Mental illness Neg Hx     Social History   Socioeconomic History  . Marital status: Single    Spouse name: Not on file  . Number of children: 0  . Years of education: Not on file  . Highest education level: Not on file  Social Needs  . Financial resource strain: Not on file  . Food insecurity - worry: Not on file  . Food insecurity - inability: Not on file  . Transportation needs - medical: Not on file  . Transportation needs - non-medical: Not on file  Occupational History  . Not on file  Tobacco Use  . Smoking status: Current Some Day Smoker    Types: Cigarettes  . Smokeless tobacco: Never Used  Substance and Sexual Activity  . Alcohol use: Yes  . Drug use: Yes    Types: Marijuana  . Sexual activity: Not on file  Other Topics Concern  . Not on file  Social History Narrative  . Not on file      Review of systems: Review of Systems  Constitutional: Negative for fever and chills.  HENT: Negative.   Eyes: Negative for blurred vision.  Respiratory: Negative for cough, shortness of breath and wheezing.   Cardiovascular: Negative for chest pain and palpitations.  Gastrointestinal: as per HPI Genitourinary: Negative for dysuria, urgency, frequency and hematuria.  Musculoskeletal: Negative for myalgias, back pain and joint pain.  Skin: Negative for itching and rash.  Neurological: Negative for dizziness, tremors, focal weakness, seizures and loss of consciousness.  Endo/Heme/Allergies: Positive for seasonal allergies.  Psychiatric/Behavioral: Negative for depression, suicidal ideas and hallucinations.  All other systems reviewed and are negative.   Physical Exam: Vitals:    12/18/17 1033  BP: 130/78  Pulse: 78   Body mass index is 31.05 kg/m. Gen:      No acute distress HEENT:  EOMI, sclera anicteric Neck:     No masses; no thyromegaly Lungs:    Clear to auscultation bilaterally; normal respiratory effort CV:         Regular rate and rhythm; no murmurs Abd:      + bowel sounds; soft, non-tender; no palpable masses, no distension Ext:    No edema; adequate peripheral perfusion Skin:      Warm and dry; no rash Neuro: alert and oriented x 3 Psych: normal mood and affect  Data Reviewed:  Reviewed labs, radiology imaging, old records and pertinent past GI work up   Assessment and Plan/Recommendations:  42 year old male with history of chronic constipation and GERD here for follow-up visit GERD stable symptoms: Instead of twice daily we will switch to omeprazole 40 mg once daily, 30 minutes before breakfast Pepcid at bedtime as needed Discussed antireflux measures and lifestyle modification in detail  Constipation improved with MiraLAX daily Discussed increase dietary fiber and fluid intake  Return as needed  15 minutes was spent face-to-face with the patient. Greater than 50% of the time used for counseling as well as treatment plan and follow-up. She had multiple questions which were answered to her satisfaction  K. Scherry Ran , MD [phone] Mon-Fri 8a-5p [phone] after 5p, weekends, holidays  CC: Dessa Phi, MD

## 2017-12-18 NOTE — Patient Instructions (Signed)
We will send in your prescriptions to your pharmacy  Follow up as needed

## 2017-12-20 ENCOUNTER — Telehealth: Payer: Self-pay | Admitting: Gastroenterology

## 2017-12-20 DIAGNOSIS — K5909 Other constipation: Secondary | ICD-10-CM

## 2017-12-20 MED ORDER — POLYETHYLENE GLYCOL 3350 17 GM/SCOOP PO POWD: 17.0000 g | Freq: Two times a day (BID) | ORAL | 1 refills | Status: AC | PRN

## 2017-12-20 MED FILL — POLYETHYLENE GLYCOL 3350 PO: 15 days supply | Qty: 510 | Fill #0

## 2017-12-20 NOTE — Telephone Encounter (Signed)
Miralax sent to pharmacy

## 2018-01-01 ENCOUNTER — Encounter: Payer: Self-pay | Admitting: Nurse Practitioner

## 2018-01-01 ENCOUNTER — Ambulatory Visit: Payer: Self-pay | Attending: Nurse Practitioner | Admitting: Nurse Practitioner

## 2018-01-01 VITALS — BP 150/95 | HR 80 | Temp 98.4°F | Ht 66.0 in | Wt 190.8 lb

## 2018-01-01 DIAGNOSIS — H66001 Acute suppurative otitis media without spontaneous rupture of ear drum, right ear: Secondary | ICD-10-CM | POA: Insufficient documentation

## 2018-01-01 DIAGNOSIS — Z91018 Allergy to other foods: Secondary | ICD-10-CM | POA: Insufficient documentation

## 2018-01-01 DIAGNOSIS — R51 Headache: Secondary | ICD-10-CM | POA: Insufficient documentation

## 2018-01-01 DIAGNOSIS — Z800 Family history of malignant neoplasm of digestive organs: Secondary | ICD-10-CM | POA: Insufficient documentation

## 2018-01-01 DIAGNOSIS — F419 Anxiety disorder, unspecified: Secondary | ICD-10-CM | POA: Insufficient documentation

## 2018-01-01 DIAGNOSIS — Z9889 Other specified postprocedural states: Secondary | ICD-10-CM | POA: Insufficient documentation

## 2018-01-01 DIAGNOSIS — Z91013 Allergy to seafood: Secondary | ICD-10-CM | POA: Insufficient documentation

## 2018-01-01 DIAGNOSIS — K219 Gastro-esophageal reflux disease without esophagitis: Secondary | ICD-10-CM | POA: Insufficient documentation

## 2018-01-01 DIAGNOSIS — F19980 Other psychoactive substance use, unspecified with psychoactive substance-induced anxiety disorder: Secondary | ICD-10-CM

## 2018-01-01 DIAGNOSIS — Z833 Family history of diabetes mellitus: Secondary | ICD-10-CM | POA: Insufficient documentation

## 2018-01-01 DIAGNOSIS — Z79899 Other long term (current) drug therapy: Secondary | ICD-10-CM | POA: Insufficient documentation

## 2018-01-01 DIAGNOSIS — F19929 Other psychoactive substance use, unspecified with intoxication, unspecified: Secondary | ICD-10-CM

## 2018-01-01 NOTE — Progress Notes (Signed)
Assessment & Plan:  Michael Daugherty was seen today for establish care and referral.  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 He denies any excessive ETOH use.    Patient has been counseled on age-appropriate routine health concerns for screening and prevention. These are reviewed and up-to-date. Referrals have been placed accordingly. Immunizations are up-to-date or declined.    Subjective:   Chief Complaint  Patient presents with  . Establish Care    Pt. is here to establish care.   . Referral    Pt would like a dental referral.    HPI Michael Daugherty 42 y.o. male presents to office today to establish care.   Acute Otitis Media He would like to be referred to a dentist. He denies any current pain and was recently treated for acute suppurative otitis media of the right ear on 12-02-2017. At that time he was endorsing right sided ear and facial pain.  He was treated with amoxicillin and flonase. Today he report symptoms have resolved however he would like to see a dentist to discuss fillings and possible implants.   GERD Symptoms controlled with prilosec.    Review of Systems  Constitutional: Negative for fever, malaise/fatigue and weight loss.  HENT: Negative.  Negative for ear discharge, ear pain, hearing loss, sinus pain, sore throat and tinnitus.   Eyes: Negative.  Negative for blurred vision, double vision and photophobia.  Respiratory: Negative.  Negative for cough and shortness of breath.   Cardiovascular: Negative.  Negative for chest pain, palpitations and leg swelling.  Gastrointestinal: Positive for constipation and heartburn. Negative for nausea and vomiting.  Musculoskeletal: Negative.  Negative for myalgias.  Neurological: Negative.  Negative  for dizziness, focal weakness, seizures and headaches.  Endo/Heme/Allergies: Positive for environmental allergies.  Psychiatric/Behavioral: Negative.  Negative for suicidal ideas.       ADD    Past Medical History:  Diagnosis Date  . Attention deficit disorder (ADD)   . GERD (gastroesophageal reflux disease)   . Seasonal allergies     Past Surgical History:  Procedure Laterality Date  . RHINOPLASTY      Family History  Problem Relation Age of Onset  . Diabetes Mother   . Stomach cancer Mother   . Stomach cancer Father   . Diabetes Brother   . Mental illness Neg Hx     Social History Reviewed with no changes to be made today.   Outpatient Medications Prior to Visit  Medication Sig Dispense Refill  . omeprazole (PRILOSEC) 40 MG capsule Take 1 capsule (40 mg total) by mouth daily. 90 capsule 3  . polyethylene glycol powder (GLYCOLAX/MIRALAX) powder Take 17 g by mouth 2 (two) times daily as needed. 578 g 1  . cetirizine (ZYRTEC) 10 MG tablet Take 1 tablet (10 mg total) by mouth daily. For allergies (Patient not taking: Reported on 01/01/2018) 90 tablet 2  . fluticasone (FLONASE) 50 MCG/ACT nasal spray Place 1 spray into both nostrils daily. (Patient not taking: Reported on 01/01/2018) 16 g 2  . benzocaine (ORAJEL) 10 % mucosal gel Use as directed 1 application in the mouth or throat as needed for mouth pain. (Patient not taking: Reported on 01/01/2018) 5.3 g 0  . famotidine (PEPCID) 20 MG tablet Take 1 tablet (20 mg total) by mouth at bedtime. (Patient not taking: Reported  on 01/01/2018) 90 tablet 3   No facility-administered medications prior to visit.     Allergies  Allergen Reactions  . Other Hives    Pecan nuts  . Oysters [Shellfish Allergy] Hives  . Soybean-Containing Drug Products Hives  . Strawberry Extract Hives       Objective:    BP (!) 150/95 (BP Location: Right Arm, Patient Position: Sitting, Cuff Size: Normal)   Pulse 80   Temp 98.4 F (36.9 C) (Oral)    Ht 5\' 6"  (1.676 m)   Wt 190 lb 12.8 oz (86.5 kg)   SpO2 99%   BMI 30.80 kg/m  Wt Readings from Last 3 Encounters:  01/01/18 190 lb 12.8 oz (86.5 kg)  12/18/17 192 lb 6.4 oz (87.3 kg)  12/02/17 192 lb (87.1 kg)    Physical Exam  Constitutional: He is oriented to person, place, and time. He appears well-developed and well-nourished. He is cooperative.  HENT:  Head: Normocephalic and atraumatic.  Mouth/Throat: Abnormal dentition. Dental caries present.  Poor dentition with multiple missing teeth  Eyes: EOM are normal.  Neck: Normal range of motion.  Cardiovascular: Normal rate, regular rhythm, normal heart sounds and intact distal pulses. Exam reveals no gallop and no friction rub.  No murmur heard. Pulmonary/Chest: Effort normal and breath sounds normal. No tachypnea. No respiratory distress. He has no decreased breath sounds. He has no wheezes. He has no rhonchi. He has no rales. He exhibits no tenderness.  Abdominal: Soft. Bowel sounds are normal.  Musculoskeletal: Normal range of motion. He exhibits no edema.  Neurological: He is alert and oriented to person, place, and time. Coordination normal.  Skin: Skin is warm and dry.  Psychiatric: He has a normal mood and affect. His behavior is normal. Judgment and thought content normal.  Nursing note and vitals reviewed.     Patient has been counseled extensively about nutrition and exercise as well as the importance of adherence with medications and regular follow-up. The patient was given clear instructions to go to ER or return to medical center if symptoms don't improve, worsen or new problems develop. The patient verbalized understanding.   Follow-up: Return if symptoms worsen or fail to improve.   Claiborne RiggZelda W Fleming, FNP-BC [facility] [place], [place] [phone]   01/01/2018, 10:49 PM

## 2018-01-01 NOTE — Patient Instructions (Signed)
Alcohol Abuse and Nutrition Alcohol abuse is any pattern of alcohol consumption that harms your health, relationships, or work. Alcohol abuse can affect how your body breaks down and absorbs nutrients from food by causing your liver to work abnormally. Additionally, many people who abuse alcohol do not eat enough carbohydrates, protein, fat, vitamins, and minerals. This can cause poor nutrition (malnutrition) and a lack of nutrients (nutrient deficiencies), which can lead to further complications. Nutrients that are commonly lacking (deficient) among people who abuse alcohol include:  Vitamins. ? Vitamin A. This is stored in your liver. It is important for your vision, metabolism, and ability to fight off infections (immunity). ? B vitamins. These include vitamins such as folate, thiamin, and niacin. These are important in new cell growth and maintenance. ? Vitamin C. This plays an important role in iron absorption, wound healing, and immunity. ? Vitamin D. This is produced by your liver, but you can also get vitamin D from food. Vitamin D is necessary for your body to absorb and use calcium.  Minerals. ? Calcium. This is important for your bones and your heart and blood vessel (cardiovascular) function. ? Iron. This is important for blood, muscle, and nervous system functioning. ? Magnesium. This plays an important role in muscle and nerve function, and it helps to control blood sugar and blood pressure. ? Zinc. This is important for the normal function of your nervous system and digestive system (gastrointestinal tract).  Nutrition is an essential component of therapy for alcohol abuse. Your health care provider or dietitian will work with you to design a plan that can help restore nutrients to your body and prevent potential complications. What is my plan? Your dietitian may develop a specific diet plan that is based on your condition and any other complications you may have. A diet plan will  commonly include:  A balanced diet. ? Grains: 6-8 oz per day. ? Vegetables: 2-3 cups per day. ? Fruits: 1-2 cups per day. ? Meat and other protein: 5-6 oz per day. ? Dairy: 2-3 cups per day.  Vitamin and mineral supplements.  What do I need to know about alcohol and nutrition?  Consume foods that are high in antioxidants, such as grapes, berries, nuts, green tea, and dark green and orange vegetables. This can help to counteract some of the stress that is placed on your liver by consuming alcohol.  Avoid food and drinks that are high in fat and sugar. Foods such as sugared soft drinks, salty snack foods, and candy contain empty calories. This means that they lack important nutrients such as protein, fiber, and vitamins.  Eat frequent meals and snacks. Try to eat 5-6 small meals each day.  Eat a variety of fresh fruits and vegetables each day. This will help you get plenty of water, fiber, and vitamins in your diet.  Drink plenty of water and other clear fluids. Try to drink at least 48-64 oz (1.5-2 L) of water per day.  If you are a vegetarian, eat a variety of protein-rich foods. Pair whole grains with plant-based proteins at meals and snacks to obtain the greatest nutrient benefit from your food. For example, eat rice with beans, put peanut butter on whole-grain toast, or eat oatmeal with sunflower seeds.  Soak beans and whole grains overnight before cooking. This can help your body to absorb the nutrients more easily.  Include foods fortified with vitamins and minerals in your diet. Commonly fortified foods include milk, orange juice, cereal, and bread.  If you are malnourished, your dietitian may recommend a high-protein, high-calorie diet. This may include: ? 2,000-3,000 calories (kilocalories) per day. ? 70-100 grams of protein per day.  Your health care provider may recommend a complete nutritional supplement beverage. This can help to restore calories, protein, and vitamins to  your body. Depending on your condition, you may be advised to consume this instead of or in addition to meals.  Limit your intake of caffeine. Replace drinks like coffee and black tea with decaffeinated coffee and herbal tea.  Eat a variety of foods that are high in omega fatty acids. These include fish, nuts and seeds, and soybeans. These foods may help your liver to recover and may also stabilize your mood.  Certain medicines may cause changes in your appetite, taste, and weight. Work with your health care provider and dietitian to make any adjustments to your medicines and diet plan.  Include other healthy lifestyle choices in your daily routine. ? Be physically active. ? Get enough sleep. ? Spend time doing activities that you enjoy.  If you are unable to take in enough food and calories by mouth, your health care provider may recommend a feeding tube. This is a tube that passes through your nose and throat, directly into your stomach. Nutritional supplement beverages can be given to you through the feeding tube to help you get the nutrients you need.  Take vitamin or mineral supplements as recommended by your health care provider. What foods can I eat? Grains Enriched pasta. Enriched rice. Fortified whole-grain bread. Fortified whole-grain cereal. Barley. Brown rice. Quinoa. Millet. Vegetables All fresh, frozen, and canned vegetables. Spinach. Kale. Artichoke. Carrots. Winter squash and pumpkin. Sweet potatoes. Broccoli. Cabbage. Cucumbers. Tomatoes. Sweet peppers. Green beans. Peas. Corn. Fruits All fresh and frozen fruits. Berries. Grapes. Mango. Papaya. Guava. Cherries. Apples. Bananas. Peaches. Plums. Pineapple. Watermelon. Cantaloupe. Oranges. Avocado. Meats and Other Protein Sources Beef liver. Lean beef. Pork. Fresh and canned chicken. Fresh fish. Oysters. Sardines. Canned tuna. Shrimp. Eggs with yolks. Nuts and seeds. Peanut butter. Beans and lentils. Soybeans.  Tofu. Dairy Whole, low-fat, and nonfat milk. Whole, low-fat, and nonfat yogurt. Cottage cheese. Sour cream. Hard and soft cheeses. Beverages Water. Herbal tea. Decaffeinated coffee. Decaffeinated green tea. 100% fruit juice. 100% vegetable juice. Instant breakfast shakes. Condiments Ketchup. Mayonnaise. Mustard. Salad dressing. Barbecue sauce. Sweets and Desserts Sugar-free ice cream. Sugar-free pudding. Sugar-free gelatin. Fats and Oils Butter. Vegetable oil, flaxseed oil, olive oil, and walnut oil. Other Complete nutrition shakes. Protein bars. Sugar-free gum. The items listed above may not be a complete list of recommended foods or beverages. Contact your dietitian for more options. What foods are not recommended? Grains Sugar-sweetened breakfast cereals. Flavored instant oatmeal. Fried breads. Vegetables Breaded or deep-fried vegetables. Fruits Dried fruit with added sugar. Candied fruit. Canned fruit in syrup. Meats and Other Protein Sources Breaded or deep-fried meats. Dairy Flavored milks. Fried cheese curds or fried cheese sticks. Beverages Alcohol. Sugar-sweetened soft drinks. Sugar-sweetened tea. Caffeinated coffee and tea. Condiments Sugar. Honey. Agave nectar. Molasses. Sweets and Desserts Chocolate. Cake. Cookies. Candy. Other Potato chips. Pretzels. Salted nuts. Candied nuts. The items listed above may not be a complete list of foods and beverages to avoid. Contact your dietitian for more information. This information is not intended to replace advice given to you by your health care provider. Make sure you discuss any questions you have with your health care provider. Document Released: 07/19/2005 Document Revised: 02/01/2016 Document Reviewed: 04/27/2014 Elsevier Interactive Patient Education    2018 [employer] ° ° °Alcohol Intoxication °Alcohol intoxication happens when a person cannot think clearly or function well (becomes impaired) after drinking alcohol.  This condition can happen even after one drink. It can be dangerous, especially if: °· The person drank a lot of alcohol in a short amount of time (binge drinking). °· The person also took certain medicines or drugs. ° °If the intoxication is very bad, a breathing machine (ventilator) may be needed until the alcohol wears off. °Follow these instructions at home: °General instructions °· Do not drive after drinking alcohol. °· Have someone stay with you. You should not be left alone while you have this condition. °· Make sure you have enough fluid in your body. To do this: °? Drink enough fluid to keep your pee (urine) clear or pale yellow. °? Avoid caffeine. It can make you thirsty. °· Take over-the-counter and prescription medicines only as told by your doctor. °To prevent this condition: °· Limit alcohol intake to no more than 1 drink a day for nonpregnant women and 2 drinks a day for men. One drink equals 12 oz of beer, 5 oz of wine, or 1½ oz of hard liquor. °· Do not drink alcohol on an empty stomach. °· Avoid drinking alcohol if: °? You are under the legal drinking age. °? You are pregnant or may be pregnant. °? You are taking medicines that you should not take with alcohol. °? Your drinking causes your medical condition to get worse. °? You need to drive or do activities that require your attention. °? You have substance use disorder. °If this condition happens again: °· Get help right away if you or someone you know: °? Has medium or very bad trouble with: °§ Movement (coordination). °§ Speech. °§ Memory. °§ Attention. °? Passes out (faints). °? Is confused. °? Is throwing up (vomiting). °· Do not leave someone with this condition alone. °Contact a doctor if: °· You do not feel better after a few days. °· You are having problems at work, at school, or at home because of drinking. °Get help right away if: °· You get shaky when you try to stop drinking. °· You start shaking and you cannot control it (have a  seizure). °· You throw up blood. The blood may be bright red or look like coffee grounds. °· You see blood in your poop (stool). The blood may: °? Be bright red. °? Make your poop black and tarry and make it smell bad. °· You start to feel light-headed. °· You pass out. °If you ever feel like you may hurt yourself or others, or have thoughts about taking your own life, get help right away. You can go to your nearest emergency department or call: °· Your local emergency services (911 in the [place] °· A suicide crisis helpline, such as the National Suicide Prevention Lifeline at 1-800-273-8255. This is open 24 hours a day. ° °This information is not intended to replace advice given to you by your health care provider. Make sure you discuss any questions you have with your health care provider. °Document Released: 03/12/2008 Document Revised: 06/14/2016 Document Reviewed: 06/14/2016 °Elsevier Interactive Patient Education © 2017 [employer] Inc. ° °

## 2018-01-10 ENCOUNTER — Ambulatory Visit: Payer: Medicaid Other | Attending: Internal Medicine

## 2018-01-16 MED FILL — OMEPRAZOLE DR 40 MG CAPSULE: 40 | 30 days supply | Qty: 30 | Fill #1

## 2018-02-04 ENCOUNTER — Telehealth: Payer: Self-pay | Admitting: Gastroenterology

## 2018-02-04 MED ORDER — OMEPRAZOLE 40 MG PO CPDR: 40.0000 mg | DELAYED_RELEASE_CAPSULE | Freq: Every day | ORAL | 3 refills | Status: AC

## 2018-02-04 NOTE — Telephone Encounter (Signed)
Omeprazole sent to patients pharmacy as requested

## 2018-02-07 MED FILL — OMEPRAZOLE DR 40 MG CAPSULE: 40 | 30 days supply | Qty: 30 | Fill #2

## 2018-03-10 MED FILL — POLYETHYLENE GLYCOL 3350 PO: 15 days supply | Qty: 510 | Fill #1

## 2018-03-10 MED FILL — OMEPRAZOLE DR 40 MG CAPSULE: 40 | 30 days supply | Qty: 30 | Fill #3

## 2018-04-07 MED FILL — OMEPRAZOLE DR 40 MG CAPSULE: 40 | 30 days supply | Qty: 30 | Fill #4

## 2018-04-09 ENCOUNTER — Telehealth: Payer: Self-pay | Admitting: *Deleted

## 2018-04-09 NOTE — Telephone Encounter (Signed)
Per fax request from pharmacy refilled Miralax with a qty of 510 grams 2 refills for patient

## 2018-05-08 MED FILL — OMEPRAZOLE DR 40 MG CAPSULE: 40 | 30 days supply | Qty: 30 | Fill #5

## 2023-03-15 ENCOUNTER — Telehealth (HOSPITAL_COMMUNITY): Payer: Self-pay | Admitting: Emergency Medicine

## 2023-03-15 NOTE — Telephone Encounter (Signed)
error
